# Patient Record
Sex: Female | Born: 1980 | Race: Black or African American | Hispanic: No | Marital: Single | Smoking: Never smoker
Health system: Southern US, Community
[De-identification: ages and names within clinical notes are randomized; demographics above are authoritative.]

## PROBLEM LIST (undated history)

## (undated) DIAGNOSIS — O039 Complete or unspecified spontaneous abortion without complication: Secondary | ICD-10-CM

## (undated) DIAGNOSIS — O24419 Gestational diabetes mellitus in pregnancy, unspecified control: Secondary | ICD-10-CM

## (undated) DIAGNOSIS — I10 Essential (primary) hypertension: Secondary | ICD-10-CM

## (undated) HISTORY — PX: COSMETIC SURGERY: SHX468

## (undated) HISTORY — DX: Gestational diabetes mellitus in pregnancy, unspecified control: O24.419

---

## 2013-08-27 HISTORY — PX: HERNIA REPAIR: SHX51

## 2014-05-05 ENCOUNTER — Encounter (HOSPITAL_BASED_OUTPATIENT_CLINIC_OR_DEPARTMENT_OTHER): Payer: Self-pay | Admitting: Emergency Medicine

## 2014-05-05 ENCOUNTER — Emergency Department (HOSPITAL_BASED_OUTPATIENT_CLINIC_OR_DEPARTMENT_OTHER)
Admission: EM | Admit: 2014-05-05 | Discharge: 2014-05-05 | Disposition: A | Discharge status: Home / Self Care | Payer: Self-pay | Attending: Emergency Medicine | Admitting: Emergency Medicine

## 2014-05-05 DIAGNOSIS — R51 Headache: Secondary | ICD-10-CM | POA: Insufficient documentation

## 2014-05-05 DIAGNOSIS — R61 Generalized hyperhidrosis: Secondary | ICD-10-CM | POA: Insufficient documentation

## 2014-05-05 DIAGNOSIS — Z79899 Other long term (current) drug therapy: Secondary | ICD-10-CM | POA: Insufficient documentation

## 2014-05-05 DIAGNOSIS — R5383 Other fatigue: Secondary | ICD-10-CM

## 2014-05-05 DIAGNOSIS — Z3202 Encounter for pregnancy test, result negative: Secondary | ICD-10-CM | POA: Insufficient documentation

## 2014-05-05 DIAGNOSIS — R5381 Other malaise: Secondary | ICD-10-CM | POA: Insufficient documentation

## 2014-05-05 DIAGNOSIS — R509 Fever, unspecified: Secondary | ICD-10-CM | POA: Insufficient documentation

## 2014-05-05 DIAGNOSIS — Z87891 Personal history of nicotine dependence: Secondary | ICD-10-CM | POA: Insufficient documentation

## 2014-05-05 DIAGNOSIS — J029 Acute pharyngitis, unspecified: Secondary | ICD-10-CM | POA: Insufficient documentation

## 2014-05-05 DIAGNOSIS — I10 Essential (primary) hypertension: Secondary | ICD-10-CM | POA: Insufficient documentation

## 2014-05-05 LAB — BASIC METABOLIC PANEL
ANION GAP: 13 (ref 5–15)
BUN: 14 mg/dL (ref 6–23)
CALCIUM: 9.4 mg/dL (ref 8.4–10.5)
CO2: 21 mEq/L (ref 19–32)
CREATININE: 1 mg/dL (ref 0.50–1.10)
Chloride: 104 mEq/L (ref 96–112)
GFR calc Af Amer: 85 mL/min — ABNORMAL LOW (ref >90)
GFR calc non Af Amer: 73 mL/min — ABNORMAL LOW (ref >90)
Glucose, Bld: 116 mg/dL — ABNORMAL HIGH (ref 70–99)
Potassium: 3.8 mEq/L (ref 3.7–5.3)
Sodium: 138 mEq/L (ref 137–147)

## 2014-05-05 LAB — CBC
HEMATOCRIT: 35 % — AB (ref 36.0–46.0)
Hemoglobin: 12.1 g/dL (ref 12.0–15.0)
MCH: 27.9 pg (ref 26.0–34.0)
MCHC: 34.6 g/dL (ref 30.0–36.0)
MCV: 80.8 fL (ref 78.0–100.0)
Platelets: 281 10*3/uL (ref 150–400)
RBC: 4.33 MIL/uL (ref 3.87–5.11)
RDW: 14.2 % (ref 11.5–15.5)
WBC: 9.5 10*3/uL (ref 4.0–10.5)

## 2014-05-05 LAB — URINALYSIS, ROUTINE W REFLEX MICROSCOPIC
BILIRUBIN URINE: NEGATIVE
GLUCOSE, UA: NEGATIVE mg/dL
HGB URINE DIPSTICK: NEGATIVE
Ketones, ur: NEGATIVE mg/dL
Leukocytes, UA: NEGATIVE
Nitrite: NEGATIVE
Protein, ur: NEGATIVE mg/dL
SPECIFIC GRAVITY, URINE: 1.024 (ref 1.005–1.030)
UROBILINOGEN UA: 0.2 mg/dL (ref 0.0–1.0)
pH: 6.5 (ref 5.0–8.0)

## 2014-05-05 LAB — PREGNANCY, URINE: Preg Test, Ur: NEGATIVE

## 2014-05-05 MED ORDER — HYDROCHLOROTHIAZIDE 25 MG PO TABS: 25.0000 mg | ORAL_TABLET | Freq: Every day | ORAL | Status: DC

## 2014-05-05 NOTE — ED Provider Notes (Signed)
CSN: [id number]     Arrival date & time 05/05/14  1119 History   First MD Initiated Contact with Patient 05/05/14 1154     Chief Complaint  Patient presents with  . Weakness     (Consider location/radiation/quality/duration/timing/severity/associated sxs/prior Treatment) HPI Comments: Patient complains of generalized weakness for several days that is greatest in the morning and improves throughout the day. She reports feeling lazy like she has no energy in the morning. She endorses URI symptoms since Monday: coughing, sneezing and subjective fevers. She also endorses Generalized HA. She has checked her BP at [facility] and was told it was high. She denies any other medical problems and doesn't take medications.       Patient is a 33 y.o. female presenting with weakness and headaches.  Weakness This is a recurrent problem. The current episode started in the past 7 days. The problem has been waxing and waning. Associated symptoms include congestion, diaphoresis, fatigue, a fever, headaches, a sore throat and weakness. Pertinent negatives include no abdominal pain, chest pain, chills, nausea, rash or vomiting. Nothing aggravates the symptoms. She has tried nothing for the symptoms. The treatment provided no relief.  Headache Pain location:  Generalized Severity currently:  0/10 Onset quality:  Gradual Duration:  3 days Progression:  Improving Context: not exposure to bright light and not loud noise   Relieved by:  Nothing Worsened by:  Nothing tried Associated symptoms: congestion, dizziness, fatigue, fever, focal weakness, sinus pressure, sore throat, URI and weakness   Associated symptoms: no abdominal pain, no back pain, no blurred vision, no diarrhea, no nausea, no photophobia and no vomiting     History reviewed. No pertinent past medical history. Past Surgical History  Procedure Laterality Date  . Cosmetic surgery      abdominal liposuction   No family history on file. History   Substance Use Topics  . Smoking status: Former Research scientist (life sciences)  . Smokeless tobacco: Never Used  . Alcohol Use: Yes     Comment: occassionally   OB History   Grav Para Term Preterm Abortions TAB SAB Ect Mult Living                 Review of Systems  Constitutional: Positive for fever, diaphoresis and fatigue. Negative for chills.  HENT: Positive for congestion, rhinorrhea, sinus pressure, sneezing and sore throat. Negative for trouble swallowing.   Eyes: Negative for blurred vision and photophobia.  Respiratory: Negative.   Cardiovascular: Negative for chest pain, palpitations and leg swelling.  Gastrointestinal: Negative for nausea, vomiting, abdominal pain and diarrhea.  Genitourinary: Negative for dysuria.  Musculoskeletal: Negative.  Negative for back pain.  Skin: Negative for rash.  Neurological: Positive for dizziness, focal weakness, weakness and headaches.      Allergies  Review of patient's allergies indicates no known allergies.  Home Medications   Prior to Admission medications   Medication Sig Start Date End Date Taking? Authorizing Provider  OVER THE COUNTER MEDICATION OTC allergy medications as needed.   Yes Historical Provider, MD  hydrochlorothiazide (HYDRODIURIL) 25 MG tablet Take 1 tablet (25 mg total) by mouth daily. 05/05/14   Olam Idler, MD   BP 146/110  Pulse 80  Temp(Src) 98.2 F (36.8 C) (Oral)  Resp 18  SpO2 100%  LMP 04/27/2014 Physical Exam  Vitals reviewed. Constitutional: She is oriented to person, place, and time. She appears well-developed and well-nourished. No distress.  HENT:  Mouth/Throat: Oropharynx is clear and moist.  Eyes: Conjunctivae are normal. Pupils are  equal, round, and reactive to light.  Cardiovascular: Normal rate.   No murmur heard. Pulmonary/Chest: Effort normal and breath sounds normal. No respiratory distress.  Abdominal: Soft. There is no tenderness.  Musculoskeletal: She exhibits no edema.  Neurological: She is  alert and oriented to person, place, and time.  Skin: No rash noted.    ED Course  Procedures (including critical care time) Labs Review Labs Reviewed  CBC - Abnormal; Notable for the following:    HCT 35.0 (*)    All other components within normal limits  BASIC METABOLIC PANEL - Abnormal; Notable for the following:    Glucose, Bld 116 (*)    GFR calc non Af Amer 73 (*)    GFR calc Af Amer 85 (*)    All other components within normal limits  PREGNANCY, URINE  URINALYSIS, ROUTINE W REFLEX MICROSCOPIC    Imaging Review No results found.   EKG Interpretation None      MDM   Final diagnoses:  Essential hypertension   Patient presented with multiple symptoms, including headache, dizziness, URI symptoms, and a generalized weakness, and found to be hypertensive in ED. Marland Kitchen  She reports having elevated blood pressure when checked at outside drugstores.  BMP within normal limits and urine pregnancy test negative.  Patient started on her side, and advised to establish PCP for ongoing evaluation and management of hypertension.     Olam Idler, MD 05/05/14 1314

## 2014-05-05 NOTE — ED Notes (Signed)
Patient states she woke up two days ago with a headache and generalized body aches.  States she used tylenol with minimal relief.  States she continued with a headache for two days, and was associated with a low grade fever .  Developed swelling in her eyes on the second day.  Now headache is resolved, continues to have swelling in her eyes, generalized weakness and low grade fever.

## 2014-05-05 NOTE — Discharge Instructions (Signed)
°Emergency Department Resource Guide °1) Find a Doctor and Pay Out of Pocket °Although you won't have to find out who is covered by your insurance plan, it is a good idea to ask around and get recommendations. You will then need to call the office and see if the doctor you have chosen will accept you as a new patient and what types of options they offer for patients who are self-pay. Some doctors offer discounts or will set up payment plans for their patients who do not have insurance, but you will need to ask so you aren't surprised when you get to your appointment. ° °2) Contact Your Local Health Department °Not all health departments have doctors that can see patients for sick visits, but many do, so it is worth a call to see if yours does. If you don't know where your local health department is, you can check in your phone book. The CDC also has a tool to help you locate your state's health department, and many state websites also have listings of all of their local health departments. ° °3) Find a Walk-in Clinic °If your illness is not likely to be very severe or complicated, you may want to try a walk in clinic. These are popping up all over the country in pharmacies, drugstores, and shopping centers. They're usually staffed by nurse practitioners or physician assistants that have been trained to treat common illnesses and complaints. They're usually fairly quick and inexpensive. However, if you have serious medical issues or chronic medical problems, these are probably not your best option. ° °No Primary Care Doctor: °- Call [facility] at  [phone] - they can help you locate a primary care doctor that  accepts your insurance, provides certain services, etc. °- Physician Referral Service- 1-800-533-3463 ° °Chronic Pain Problems: °Organization         Address  Phone   Notes  °[facility]  [phone] Patients need to be referred by their primary care doctor.  ° °Medication  Assistance: °Organization         Address  Phone   Notes  °[place] [facility] [address] °[place], [place] [address] [phone]) [phone] --Must be a resident of [place] °-- Must have NO insurance coverage whatsoever (no Medicaid/ Medicare, etc.) °-- The pt. MUST have a primary care doctor that directs their care regularly and follows them in the community °  °[facility]  (866) 331-1348   °[employer]  (888) 892-1162   ° °Agencies that provide inexpensive medical care: °Organization         Address  Phone   Notes  °[facility]  ([phone]) [phone]   °[facility]    ([phone]) [phone]   °[facility] [address] °[place], [place] [address] ([phone]) [phone]   °[facility] [address], °[place] (336) [phone]   °[facility]    (336) 373-0678   °[facility]    (336) 272-1050   °[facility] ° [address], [place] Phone:  (336) [phone], Fax:  ([phone]) [phone] Hours of Operation:  9 am - 6 pm, M-F.  Also accepts Medicaid/Medicare and self-pay.  °[facility] ° [address], [place] Phone: ([phone]) [phone], Fax: ([phone]) [phone]. Hours of Operation:  8:30 am - 5:30 pm, M-F.  Also accepts Medicaid and self-pay.  °[facility] [place] [address], [place] Phone: ([phone]) [phone]   °[facility] [address], [place], [place] (336)[phone], Ext. 123 Mondays & Thursdays: 7-9 AM.  First 15 patients are seen on a first come, first serve basis. °  ° °Medicaid-accepting [place] Providers: ° °Organization         Address  Phone   Notes  °[facility] [address], [place] ([phone]) [phone] Also accepts self-pay patients.  °[facility] [address], [place] ° ([phone]) [phone]   °[facility] [address], [place]  ([phone]) [phone]   °[facility] [address] [place] [address], [place] ([phone]) [phone]   °[facility] [address], [place]  ° ([phone]) [phone] Only accepts [place] Access Medicaid patients after they have their name applied to their card.  ° °Self-Pay (no insurance) in [place]: ° °Organization         Address  Phone   Notes  °Sickle Cell Patients, [facility] [address], [place] ([phone]) [phone]   °[facility] [address], [place] ([phone]) [phone]   °[facility] ° [address], [place] (336) [phone]   °[facility]/Dr. Osei-Bonsu ° [address] [place] [address], [place] or [address], [place] [phone]) [phone] Phone number for both [place] and [place] locations is the same.  °[facility] [address], [place] ([phone]) [phone]   °[facility] [address] [place] [address], [place] or [address] ([phone]) [phone] °(336) [phone]   °[facility] [address], [place] (336) [phone], phone; ([phone]) [phone], fax Sees patients 1st and 3rd Saturday of every month.  Must not qualify for public or private insurance (i.e. Medicaid, Medicare, [place] Health Choice, Veterans' Benefits) • Household income should be no more than 200% of the poverty level •The clinic cannot treat you if you are pregnant or think you are pregnant • Sexually transmitted diseases are not treated at the clinic.  ° ° °Dental Care: °Organization         Address  Phone  Notes  °[place] [facility] [address], [place] ([phone]) [phone] Accepts children up to age 21 who are enrolled in Medicaid or [place] Health Choice; pregnant women with a Medicaid card; and children who have applied for Medicaid or [place] Health Choice, but were declined, whose parents can pay a reduced fee at time of service.  °[place]  [facility] [place]  [address], [place] (336) 641-7733 Accepts children up to age 21 who are enrolled in Medicaid or [place] Health Choice; pregnant women with a Medicaid card; and children who have applied for Medicaid or [place] Health Choice, but were declined, whose parents can pay a reduced fee at time of service.  °[facility] ° [address],  ([phone]) [phone] Patients are seen by appointment only. Walk-ins are not accepted. [facility] will see patients 18 years of age and older. °Monday - Tuesday (8am-5pm) °Most Wednesdays (8:30-5pm) °$30 per visit, cash only  °[facility] ° [address], [place] ([phone]) [phone] Patients are seen by appointment only. Walk-ins are not accepted. [facility] will see patients 18 years of age and older. °One   Wednesday Evening (Monthly: Volunteer Based).  $30 per visit, cash only  [place]  [phone] for adults; Children under age 66, call Graduate Pediatric Dentistry at [phone]. Children aged 68-14, please call [phone] to request a pediatric application.  Dental services are provided in all areas of dental care including fillings, crowns and bridges, complete and partial dentures, implants, gum treatment, root canals, and extractions. Preventive care is also provided. Treatment is provided to both adults and children. Patients are selected via a lottery and there is often a waiting list.   [facility] [address], [place]  ([phone])[phone] [email or web address]   [facility] [address] [place], [place] [phone], Ext. [phone] Second and Fourth Thursday of each month, opens at 6:30 AM; Clinic ends at 9 AM.  Patients are seen on a first-come first-served basis, and a limited number are seen during each clinic.   [facility]  [address] [place], [place] [phone]    Eligibility Requirements You must have lived in [place], [place], or [place] counties for at least the last three months.   You cannot be eligible for state or federal sponsored [employer], including [employer], [place], or [employer].   You generally cannot be eligible for healthcare insurance through your employer.    How to apply: Eligibility screenings are held every Tuesday and Wednesday afternoon from 1:00 pm until 4:00 pm. You do not need an appointment for the interview!  [facility] [address] [place], [place]   [place]  [place] Department  [phone]     DASH Eating Plan DASH stands for "Dietary Approaches to Stop Hypertension." The DASH eating plan is a healthy eating plan that has been shown to reduce high blood pressure (hypertension). Additional health benefits may include reducing the risk of type 2 diabetes mellitus, heart disease, and stroke. The DASH eating plan may also help with weight loss. WHAT DO I NEED TO KNOW ABOUT THE DASH EATING PLAN? For the DASH eating plan, you will follow these general guidelines:  Choose foods with a percent daily value for sodium of less than 5% (as listed on the food label).  Use salt-free seasonings or herbs instead of table salt or sea salt.  Check with your health care provider or pharmacist before using salt substitutes.  Eat lower-sodium products, often labeled as "lower sodium" or "no salt added."  Eat fresh foods.  Eat more vegetables, fruits, and low-fat dairy products.  Choose whole grains. Look for the word "whole" as the first word in the ingredient list.  Choose fish and skinless chicken or [place] more often than red meat. Limit fish, poultry, and meat to 6 oz (170 g) each day.  Limit sweets, desserts, sugars, and sugary drinks.  Choose heart-healthy  fats.  Limit cheese to 1 oz (28 g) per day.  Eat more home-cooked food and less restaurant, buffet, and fast food.  Limit fried foods.  Cook foods using methods other than frying.  Limit canned vegetables. If you do use them, rinse them well to decrease the sodium.  When eating at a restaurant, ask that your food be prepared with less salt, or no salt if possible. WHAT FOODS CAN I EAT? Seek help from a dietitian for individual calorie needs. Grains Whole grain or whole wheat bread. Brown rice. Whole grain  or whole wheat pasta. Quinoa, bulgur, and whole grain cereals. Low-sodium cereals. Corn or whole wheat flour tortillas. Whole grain cornbread. Whole grain crackers. Low-sodium crackers. Vegetables Fresh or frozen vegetables (raw, steamed, roasted, or grilled). Low-sodium or reduced-sodium tomato and vegetable juices. Low-sodium or reduced-sodium tomato sauce and paste. Low-sodium or reduced-sodium canned vegetables.  Fruits All fresh, canned (in natural juice), or frozen fruits. Meat and Other Protein Products Ground beef (85% or leaner), grass-fed beef, or beef trimmed of fat. Skinless chicken or [place]. Ground chicken or [place]. Pork trimmed of fat. All fish and seafood. Eggs. Dried beans, peas, or lentils. Unsalted nuts and seeds. Unsalted canned beans. Dairy Low-fat dairy products, such as skim or 1% milk, 2% or reduced-fat cheeses, low-fat ricotta or cottage cheese, or plain low-fat yogurt. Low-sodium or reduced-sodium cheeses. Fats and Oils Tub margarines without trans fats. Light or reduced-fat mayonnaise and salad dressings (reduced sodium). Avocado. Safflower, olive, or canola oils. Natural peanut or almond butter. Other Unsalted popcorn and pretzels. The items listed above may not be a complete list of recommended foods or beverages. Contact your dietitian for more options. WHAT FOODS ARE NOT RECOMMENDED? Grains White bread. White pasta. White rice. Refined cornbread.  Bagels and croissants. Crackers that contain trans fat. Vegetables Creamed or fried vegetables. Vegetables in a cheese sauce. Regular canned vegetables. Regular canned tomato sauce and paste. Regular tomato and vegetable juices. Fruits Dried fruits. Canned fruit in light or heavy syrup. Fruit juice. Meat and Other Protein Products Fatty cuts of meat. Ribs, chicken wings, bacon, sausage, bologna, salami, chitterlings, fatback, hot dogs, bratwurst, and packaged luncheon meats. Salted nuts and seeds. Canned beans with salt. Dairy Whole or 2% milk, cream, half-and-half, and cream cheese. Whole-fat or sweetened yogurt. Full-fat cheeses or blue cheese. Nondairy creamers and whipped toppings. Processed cheese, cheese spreads, or cheese curds. Condiments Onion and garlic salt, seasoned salt, table salt, and sea salt. Canned and packaged gravies. Worcestershire sauce. Tartar sauce. Barbecue sauce. Teriyaki sauce. Soy sauce, including reduced sodium. Steak sauce. Fish sauce. Oyster sauce. Cocktail sauce. Horseradish. Ketchup and mustard. Meat flavorings and tenderizers. Bouillon cubes. Hot sauce. Tabasco sauce. Marinades. Taco seasonings. Relishes. Fats and Oils Butter, stick margarine, lard, shortening, ghee, and bacon fat. Coconut, palm kernel, or palm oils. Regular salad dressings. Other Pickles and olives. Salted popcorn and pretzels. The items listed above may not be a complete list of foods and beverages to avoid. Contact your dietitian for more information. WHERE CAN I FIND MORE INFORMATION? National Heart, Lung, and Blood Institute: travelstabloid.com Document Released: 08/02/2011 Document Revised: 12/28/2013 Document Reviewed: 06/17/2013 [facility] Patient Information 2015 [place], [place]. This information is not intended to replace advice given to you by your health care provider. Make sure you discuss any questions you have with your health care provider.

## 2014-05-06 NOTE — ED Provider Notes (Signed)
I saw and evaluated the patient, reviewed the resident's note and I agree with the findings and plan.   .Face to face Exam:  General:  Awake HEENT:  Atraumatic Resp:  Normal effort Abd:  Nondistended Neuro:No focal weakness   Dot Lanes, MD 05/06/14 1415

## 2014-05-25 ENCOUNTER — Emergency Department (HOSPITAL_BASED_OUTPATIENT_CLINIC_OR_DEPARTMENT_OTHER)
Admission: EM | Admit: 2014-05-25 | Discharge: 2014-05-25 | Disposition: A | Discharge status: Home / Self Care | Payer: Self-pay | Attending: Emergency Medicine | Admitting: Emergency Medicine

## 2014-05-25 ENCOUNTER — Encounter (HOSPITAL_BASED_OUTPATIENT_CLINIC_OR_DEPARTMENT_OTHER): Payer: Self-pay | Admitting: Emergency Medicine

## 2014-05-25 DIAGNOSIS — R42 Dizziness and giddiness: Secondary | ICD-10-CM | POA: Insufficient documentation

## 2014-05-25 DIAGNOSIS — R509 Fever, unspecified: Secondary | ICD-10-CM | POA: Insufficient documentation

## 2014-05-25 DIAGNOSIS — Z87891 Personal history of nicotine dependence: Secondary | ICD-10-CM | POA: Insufficient documentation

## 2014-05-25 DIAGNOSIS — H539 Unspecified visual disturbance: Secondary | ICD-10-CM | POA: Insufficient documentation

## 2014-05-25 DIAGNOSIS — I10 Essential (primary) hypertension: Secondary | ICD-10-CM | POA: Insufficient documentation

## 2014-05-25 MED ORDER — HYDROCHLOROTHIAZIDE 25 MG PO TABS
25.0000 mg | ORAL_TABLET | Freq: Every day | ORAL | Status: DC
Start: 2014-05-25 — End: 2014-08-16

## 2014-05-25 NOTE — ED Notes (Addendum)
Fever, headache dizziness x 4 weeks. She also states she does not want to take her BP medication any longer. Education attempted. She has been told the importance of a Primary Care Physician for follow up, stroke and other risk of untreated HTN.

## 2014-05-25 NOTE — Discharge Instructions (Signed)

## 2014-05-25 NOTE — ED Provider Notes (Signed)
CSN: [id number]     Arrival date & time 05/25/14  1258 History   First MD Initiated Contact with Patient 05/25/14 1323     Chief Complaint  Patient presents with  . Fever     (Consider location/radiation/quality/duration/timing/severity/associated sxs/prior Treatment) HPI Comments: Pt presents with headache.  She describes a 3 day hx of a bifrontal headache with some intermittent blurry vision and dizziness.  No n/v.  Has felt hot, but has not checked temperature.  Has not taken anything today for fever/pain.  Has hx of recently diagnosed HTN, was having these same symptoms when she was diagnosed a month ago.  Was started on HCTZ and the headache and dizziness went away after starting the medicine.  She stopped taking the medicine about 4-5 days ago because it was making her feel tired.  It was at that point that the symptoms returned.  She did take her HCTZ today for the first time in a few days.  She denies any neck pain.  No numbness or weakness to extremities.  No speech deficits or balance problems.  Patient is a 33 y.o. female presenting with fever.  Fever Associated symptoms: headaches   Associated symptoms: no chest pain, no chills, no congestion, no cough, no diarrhea, no nausea, no rash, no rhinorrhea and no vomiting     History reviewed. No pertinent past medical history. Past Surgical History  Procedure Laterality Date  . Cosmetic surgery      abdominal liposuction   No family history on file. History  Substance Use Topics  . Smoking status: Former Research scientist (life sciences)  . Smokeless tobacco: Never Used  . Alcohol Use: Yes     Comment: occassionally   OB History   Grav Para Term Preterm Abortions TAB SAB Ect Mult Living                 Review of Systems  Constitutional: Positive for fever (hot flashes, but no documented temp). Negative for chills, diaphoresis and fatigue.  HENT: Negative for congestion, rhinorrhea and sneezing.   Eyes: Positive for visual disturbance.   Respiratory: Negative for cough, chest tightness and shortness of breath.   Cardiovascular: Negative for chest pain and leg swelling.  Gastrointestinal: Negative for nausea, vomiting, abdominal pain, diarrhea and blood in stool.  Genitourinary: Negative for frequency, hematuria, flank pain and difficulty urinating.  Musculoskeletal: Negative for arthralgias and back pain.  Skin: Negative for rash.  Neurological: Positive for dizziness and headaches. Negative for speech difficulty, weakness and numbness.      Allergies  Review of patient's allergies indicates no known allergies.  Home Medications   Prior to Admission medications   Medication Sig Start Date End Date Taking? Authorizing Provider  hydrochlorothiazide (HYDRODIURIL) 25 MG tablet Take 1 tablet (25 mg total) by mouth daily. 05/05/14   Olam Idler, MD  hydrochlorothiazide (HYDRODIURIL) 25 MG tablet Take 1 tablet (25 mg total) by mouth daily. 05/25/14   Malvin Johns, MD  OVER THE COUNTER MEDICATION OTC allergy medications as needed.    Historical Provider, MD   BP 111/76  Pulse 71  Temp(Src) 98.8 F (37.1 C) (Oral)  Resp 18  Ht 5\' 7"  (1.702 m)  Wt 140 lb (63.504 kg)  BMI 21.92 kg/m2  SpO2 100%  LMP 05/20/2014 Physical Exam  Constitutional: She is oriented to person, place, and time. She appears well-developed and well-nourished.  HENT:  Head: Normocephalic and atraumatic.  Eyes: Pupils are equal, round, and reactive to light.  Neck: Normal range  of motion. Neck supple.  Cardiovascular: Normal rate, regular rhythm and normal heart sounds.   Pulmonary/Chest: Effort normal and breath sounds normal. No respiratory distress. She has no wheezes. She has no rales. She exhibits no tenderness.  Abdominal: Soft. Bowel sounds are normal. There is no tenderness. There is no rebound and no guarding.  Musculoskeletal: Normal range of motion. She exhibits no edema.  Lymphadenopathy:    She has no cervical adenopathy.   Neurological: She is alert and oriented to person, place, and time. She has normal strength. No cranial nerve deficit or sensory deficit. GCS eye subscore is 4. GCS verbal subscore is 5. GCS motor subscore is 6.  FTN intact, gait normal  Skin: Skin is warm and dry. No rash noted.  Psychiatric: She has a normal mood and affect.    ED Course  Procedures (including critical care time) Labs Review Labs Reviewed - No data to display  Imaging Review No results found.   EKG Interpretation None      MDM   Final diagnoses:  Essential hypertension    Pt with frontal headache, no neuro deficits.  No suggestions of CVA, ICH.  Symptoms same as she was having before she started her HCTZ last month.  Likely related to HTN.  She did take her med this am and BP okay now.  Will d/c.  Given another rx for HCTZ.  Explained importance of taking meds.  Pt does not want to change to another BP med.  Given resource guide for outpt f/u.    Malvin Johns, MD 05/25/14 [phone]

## 2014-06-02 ENCOUNTER — Encounter: Payer: Self-pay | Admitting: Medical

## 2014-06-02 DIAGNOSIS — Z0289 Encounter for other administrative examinations: Secondary | ICD-10-CM

## 2014-06-03 NOTE — Progress Notes (Signed)
This encounter was created in error - please disregard.

## 2014-06-15 ENCOUNTER — Encounter: Payer: Self-pay | Admitting: Medical

## 2014-07-05 ENCOUNTER — Ambulatory Visit: Payer: Self-pay | Admitting: Medical

## 2014-07-05 ENCOUNTER — Emergency Department (HOSPITAL_BASED_OUTPATIENT_CLINIC_OR_DEPARTMENT_OTHER)
Admission: EM | Admit: 2014-07-05 | Discharge: 2014-07-05 | Disposition: A | Discharge status: Home / Self Care | Payer: Self-pay | Attending: Emergency Medicine | Admitting: Emergency Medicine

## 2014-07-05 ENCOUNTER — Encounter (HOSPITAL_BASED_OUTPATIENT_CLINIC_OR_DEPARTMENT_OTHER): Payer: Self-pay | Admitting: *Deleted

## 2014-07-05 ENCOUNTER — Telehealth: Payer: Self-pay | Admitting: *Deleted

## 2014-07-05 DIAGNOSIS — Z87891 Personal history of nicotine dependence: Secondary | ICD-10-CM | POA: Insufficient documentation

## 2014-07-05 DIAGNOSIS — J069 Acute upper respiratory infection, unspecified: Secondary | ICD-10-CM | POA: Insufficient documentation

## 2014-07-05 DIAGNOSIS — I10 Essential (primary) hypertension: Secondary | ICD-10-CM | POA: Insufficient documentation

## 2014-07-05 DIAGNOSIS — Z79899 Other long term (current) drug therapy: Secondary | ICD-10-CM | POA: Insufficient documentation

## 2014-07-05 HISTORY — DX: Essential (primary) hypertension: I10

## 2014-07-05 MED ORDER — HYDROCHLOROTHIAZIDE 25 MG PO TABS: 25.0000 mg | ORAL_TABLET | Freq: Every day | ORAL | Status: DC

## 2014-07-05 NOTE — ED Notes (Signed)
MD at bedside. 

## 2014-07-05 NOTE — ED Provider Notes (Signed)
CSN: [id number]     Arrival date & time 07/05/14  9935 History   First MD Initiated Contact with Patient 07/05/14 0840     Chief Complaint  Patient presents with  . Influenza     (Consider location/radiation/quality/duration/timing/severity/associated sxs/prior Treatment) HPI Complains of cough and sneeze and rhinorrhea onset 5 days ago. No other symptoms. Treated with over-the-counter cold medicine without relief.( does not recall its name). No sore throat no headache no myalgias. States nothing hurts. No lightheadedness no other associated symptoms. Nothing makes symptoms better or worse Past Medical History  Diagnosis Date  . Hypertension    Past Surgical History  Procedure Laterality Date  . Cosmetic surgery      abdominal liposuction   No family history on file. History  Substance Use Topics  . Smoking status: Former Research scientist (life sciences)  . Smokeless tobacco: Never Used  . Alcohol Use: Yes     Comment: occassionally   OB History    Gravida Para Term Preterm AB TAB SAB Ectopic Multiple Living   0 0 0 0 0 0 0 0 0 0      Review of Systems  Constitutional: Positive for fever.       Subjective fever  HENT: Positive for rhinorrhea and sneezing.   Respiratory: Positive for cough. Negative for shortness of breath.   Cardiovascular: Negative.   Gastrointestinal: Negative.   Musculoskeletal: Negative.   Skin: Negative.   Neurological: Negative.   Psychiatric/Behavioral: Negative.   All other systems reviewed and are negative.     Allergies  Review of patient's allergies indicates no known allergies.  Home Medications   Prior to Admission medications   Medication Sig Start Date End Date Taking? Authorizing Provider  hydrochlorothiazide (HYDRODIURIL) 25 MG tablet Take 1 tablet (25 mg total) by mouth daily. 05/05/14   Olam Idler, MD  hydrochlorothiazide (HYDRODIURIL) 25 MG tablet Take 1 tablet (25 mg total) by mouth daily. 05/25/14   Malvin Johns, MD  OVER THE COUNTER MEDICATION  OTC allergy medications as needed.    Historical Provider, MD   BP 128/90 mmHg  Pulse 84  Temp(Src) 98.8 F (37.1 C) (Oral)  Ht 5\' 7"  (1.702 m)  Wt 130 lb (58.968 kg)  BMI 20.36 kg/m2  SpO2 100%  LMP 06/12/2014 Physical Exam  Constitutional: She appears well-developed and well-nourished. No distress.  HENT:  Head: Normocephalic and atraumatic.  Bilateral tympanic membranes normal  Eyes: Conjunctivae are normal. Pupils are equal, round, and reactive to light.  Neck: Neck supple. No tracheal deviation present. No thyromegaly present.  Cardiovascular: Normal rate and regular rhythm.   No murmur heard. Pulmonary/Chest: Effort normal and breath sounds normal.  Abdominal: Soft. Bowel sounds are normal. She exhibits no distension. There is no tenderness.  Musculoskeletal: Normal range of motion. She exhibits no edema or tenderness.  Lymphadenopathy:    She has no cervical adenopathy.  Neurological: She is alert. Coordination normal.  Skin: Skin is warm and dry. No rash noted.  Psychiatric: She has a normal mood and affect.  Nursing note and vitals reviewed.   ED Course  Procedures (including critical care time) Labs Review Labs Reviewed - No data to display  Imaging Review No results found.   EKG Interpretation None      MDM  Doubt influenza. No myalgias, no sore throat, no headache,  Final diagnoses:  None  plan prescription HCTZ patient has run out. 30 day supply. Referral health Department or resource guide to get primary care physician Diagnosis #1  upper respiratory tract infection #2 medication refill      Orlie Dakin, MD 07/05/14 [phone]

## 2014-07-05 NOTE — Discharge Instructions (Signed)
Upper Respiratory Infection, Adult Go to your local health department or contact any of the numbers on the resource guide to get a primary care physician. Your new physician can write for medication refills for you An upper respiratory infection (URI) is also sometimes known as the common cold. The upper respiratory tract includes the nose, sinuses, throat, trachea, and bronchi. Bronchi are the airways leading to the lungs. Most people improve within 1 week, but symptoms can last up to 2 weeks. A residual cough may last even longer.  CAUSES Many different viruses can infect the tissues lining the upper respiratory tract. The tissues become irritated and inflamed and often become very moist. Mucus production is also common. A cold is contagious. You can easily spread the virus to others by oral contact. This includes kissing, sharing a glass, coughing, or sneezing. Touching your mouth or nose and then touching a surface, which is then touched by another person, can also spread the virus. SYMPTOMS  Symptoms typically develop 1 to 3 days after you come in contact with a cold virus. Symptoms vary from person to person. They may include:  Runny nose.  Sneezing.  Nasal congestion.  Sinus irritation.  Sore throat.  Loss of voice (laryngitis).  Cough.  Fatigue.  Muscle aches.  Loss of appetite.  Headache.  Low-grade fever. DIAGNOSIS  You might diagnose your own cold based on familiar symptoms, since most people get a cold 2 to 3 times a year. Your caregiver can confirm this based on your exam. Most importantly, your caregiver can check that your symptoms are not due to another disease such as strep throat, sinusitis, pneumonia, asthma, or epiglottitis. Blood tests, throat tests, and X-rays are not necessary to diagnose a common cold, but they may sometimes be helpful in excluding other more serious diseases. Your caregiver will decide if any further tests are required. RISKS AND  COMPLICATIONS  You may be at risk for a more severe case of the common cold if you smoke cigarettes, have chronic heart disease (such as heart failure) or lung disease (such as asthma), or if you have a weakened immune system. The very young and very old are also at risk for more serious infections. Bacterial sinusitis, middle ear infections, and bacterial pneumonia can complicate the common cold. The common cold can worsen asthma and chronic obstructive pulmonary disease (COPD). Sometimes, these complications can require emergency medical care and may be life-threatening. PREVENTION  The best way to protect against getting a cold is to practice good hygiene. Avoid oral or hand contact with people with cold symptoms. Wash your hands often if contact occurs. There is no clear evidence that vitamin C, vitamin E, echinacea, or exercise reduces the chance of developing a cold. However, it is always recommended to get plenty of rest and practice good nutrition. TREATMENT  Treatment is directed at relieving symptoms. There is no cure. Antibiotics are not effective, because the infection is caused by a virus, not by bacteria. Treatment may include:  Increased fluid intake. Sports drinks offer valuable electrolytes, sugars, and fluids.  Breathing heated mist or steam (vaporizer or shower).  Eating chicken soup or other clear broths, and maintaining good nutrition.  Getting plenty of rest.  Using gargles or lozenges for comfort.  Controlling fevers with ibuprofen or acetaminophen as directed by your caregiver.  Increasing usage of your inhaler if you have asthma. Zinc gel and zinc lozenges, taken in the first 24 hours of the common cold, can shorten the duration  and lessen the severity of symptoms. Pain medicines may help with fever, muscle aches, and throat pain. A variety of non-prescription medicines are available to treat congestion and runny nose. Your caregiver can make recommendations and may  suggest nasal or lung inhalers for other symptoms.  HOME CARE INSTRUCTIONS   Only take over-the-counter or prescription medicines for pain, discomfort, or fever as directed by your caregiver.  Use a warm mist humidifier or inhale steam from a shower to increase air moisture. This may keep secretions moist and make it easier to breathe.  Drink enough water and fluids to keep your urine clear or pale yellow.  Rest as needed.  Return to work when your temperature has returned to normal or as your caregiver advises. You may need to stay home longer to avoid infecting others. You can also use a face mask and careful hand washing to prevent spread of the virus. SEEK MEDICAL CARE IF:   After the first few days, you feel you are getting worse rather than better.  You need your caregiver's advice about medicines to control symptoms.  You develop chills, worsening shortness of breath, or brown or red sputum. These may be signs of pneumonia.  You develop yellow or brown nasal discharge or pain in the face, especially when you bend forward. These may be signs of sinusitis.  You develop a fever, swollen neck glands, pain with swallowing, or white areas in the back of your throat. These may be signs of strep throat. SEEK IMMEDIATE MEDICAL CARE IF:   You have a fever.  You develop severe or persistent headache, ear pain, sinus pain, or chest pain.  You develop wheezing, a prolonged cough, cough up blood, or have a change in your usual mucus (if you have chronic lung disease).  You develop sore muscles or a stiff neck. Document Released: 02/06/2001 Document Revised: 11/05/2011 Document Reviewed: 11/18/2013 [facility] Patient Information 2015 [place], [place]. This information is not intended to replace advice given to you by your health care provider. Make sure you discuss any questions you have with your health care provider.  Emergency Department Resource Guide 1) Find a Doctor and Pay Out of  Pocket Although you won't have to find out who is covered by your insurance plan, it is a good idea to ask around and get recommendations. You will then need to call the office and see if the doctor you have chosen will accept you as a new patient and what types of options they offer for patients who are self-pay. Some doctors offer discounts or will set up payment plans for their patients who do not have insurance, but you will need to ask so you aren't surprised when you get to your appointment.  2) Contact Your Local Health Department Not all health departments have doctors that can see patients for sick visits, but many do, so it is worth a call to see if yours does. If you don't know where your local health department is, you can check in your phone book. The CDC also has a tool to help you locate your state's health department, and many state websites also have listings of all of their local health departments.  3) Find a [facility] If your illness is not likely to be very severe or complicated, you may want to try a walk in clinic. These are popping up all over the country in pharmacies, drugstores, and shopping centers. They're usually staffed by nurse practitioners or physician assistants that have  been trained to treat common illnesses and complaints. They're usually fairly quick and inexpensive. However, if you have serious medical issues or chronic medical problems, these are probably not your best option.  No Primary Care Doctor: - Call Health Connect at  [phone] - they can help you locate a primary care doctor that  accepts your insurance, provides certain services, etc. - Physician Referral Service- [phone]  Chronic Pain Problems: Organization         Address  Phone   Notes  [facility]  [phone] Patients need to be referred by their primary care doctor.   Medication Assistance: Organization         Address  Phone   Notes  [facility]  Medication [facility] [address] [place], [place] [address] [phone] --Must be a resident of [facility] -- Must have NO insurance coverage whatsoever (no Medicaid/ Medicare, etc.) -- The pt. MUST have a primary care doctor that directs their care regularly and follows them in the community   [facility]ist  [phone]   [employer]  [phone]    Agencies that provide inexpensive medical care: Organization         Address  Phone   Notes  [place]  [phone]   [facility]    [phone]   [facility], [place] [address] [phone]   [place] [address], [place] [phone]   Planned Parenthood    [phone]   [facility]    [phone]   [place] and [place] [address], [place] Phone:  [phone], Fax:  [phone] Hours of Operation:  9 am - 6 pm, M-F.  Also accepts Medicaid/Medicare and self-pay.  [facility] for [place], [address], [place] Phone: [phone], Fax: [phone]. Hours of Operation:  8:30 am - 5:30 pm, M-F.  Also accepts Medicaid and self-pay.  [facility] [address] [place] Phone: [phone]   [place], [place], [place] [phone], Ext. [phone] Mondays & Thursdays: 7-9 AM.  First 15 patients are seen on a first come, first serve basis.    [place] Providers:  Organization         Address  Phone   Notes  [facility] [address], [place] [phone] Also accepts self-pay patients.  [facility] [address], [place]  [phone]   [place], [address], [place] [phone]   [facility] [address] [place] [phone]   Lucianne Lei [address], [place]   [phone] Only accepts [place] Access [place] patients after they have their name applied to their card.   Self-Pay (no insurance) in [facility]:  Organization         Address  Phone   Notes  Sickle Cell Patients, [facility] [place] [phone]   [facility] [place] [phone]   [facility] Pontes Urgent [place]  [place], [address], [place] [phone]   [facility]/Dr. Osei-Bonsu  [address] or  [place], [address], [place] [phone] Phone number for both [facility] and [place] locations is the same.  Urgent Medical and [facility] [address], [place] [phone]   [facility] [address] [place] or [address] [phone]   [facility] [address] [place] [phone], phone; [phone], fax Sees patients 1st and 3rd Saturday of every month.  Must not qualify for public or private insurance (i.e. Medicaid, Medicare, [place] Health Choice, Veterans' Benefits)  Household income should be no more than 200% of the poverty level The clinic cannot treat you if you are pregnant or think you are pregnant  Sexually transmitted diseases are not treated at the clinic.    Dental Care: Organization         Address  Phone  Notes  [facility] Department of [facility] [place] [phone] Accepts children up to age 59 who are enrolled in [place] or [place]; pregnant women with a Medicaid card; and children who have applied for Medicaid or [place] Health Choice, but were declined, whose parents can pay a reduced fee at time of service.  [facility] Department of [facility]  [address], [place]  [phone] Accepts children up to age 29 who are enrolled in [place] or [place]; pregnant women with a Medicaid card; and children who have applied for Medicaid or [place] Health Choice, but were declined, whose parents can pay a reduced fee at time of service.  [place] Adult Dental Access PROGRAM  [place] [phone] Patients are seen by appointment only. Walk-ins are not accepted. [place] will see patients 57 years of age and older. Monday - Tuesday (8am-5pm) Most Wednesdays (8:30-5pm) $30 per visit, cash only  [facility] Adult Dental Access PROGRAM  [address], [facility] [phone] Patients are seen by appointment only. Walk-ins are not accepted. [place] will see patients 61 years of age and older. One Wednesday Evening (Monthly: Volunteer Based).  $30 per visit, cash only  [place]  [phone] for adults; Children under age 44, call Graduate Pediatric Dentistry at [phone]. Children aged 11-14, please call [phone] to request a pediatric application.  Dental services are provided in all areas of dental care including fillings, crowns and bridges, complete and partial dentures, implants, gum treatment, root canals, and extractions. Preventive care is also provided. Treatment is provided to both adults and children. Patients are selected via a lottery and there is often a waiting list.   [facility] [address] [place]  [phone] [email or web address]   [facility] [address] [place], [place] [phone], Ext. [phone] Second and Fourth Thursday of each month, opens at 6:30 AM; Clinic ends at 9 AM.  Patients are seen on a first-come first-served basis, and a limited number are seen during each clinic.   [facility]  [address] Hillard Danker [place], [place] [phone]   Eligibility Requirements You must have lived in [place], [place], or [place]  counties for at least the last three months.   You cannot be eligible for state or federal sponsored [employer], including [employer], [place], or [employer].   You generally cannot be eligible for healthcare insurance through your employer.    How to apply: Eligibility screenings  are held every Tuesday and Wednesday afternoon from 1:00 pm until 4:00 pm. You do not need an appointment for the interview!  [facility] [address], [place], [place]   [place]  Welcome Department  [place]  [phone]    Behavioral Health Resources in the Community: Intensive Outpatient Programs Organization         Address  Phone  Notes  [place]. [address], [place], [place] [phone]   [facility] Outpatient [address], [place], [place]   ADS: Alcohol & Drug Svcs [address] [place], [place]   [place] [address],  [place], [place] or [phone]   Substance Abuse Resources Organization         Address  Phone  Notes  Alcohol and Drug Services  [phone]   [place]  [phone]   [facility] [place]   Chinita Pester  [phone]   Residential & Outpatient Substance Abuse Program  [phone]   Psychological Services Organization         Address  Phone  Notes  [facility] [place]  [place]  [phone]   [place] [address] [place] or [phone]    Mobile Crisis Teams Organization         Address  Phone  Notes  [facility], Mobile Crisis Care Unit  [phone]   [facility]ive Psychotherapeutic Services  [address] [place], [place]   [place] Levels [address], [place] [phone]    Self-Help/Support Groups Organization         Address  Phone             Notes  [place]. of [place] - variety of support groups  [place] Call for more information  Narcotics Anonymous (NA), [facility] [address], [employer] [place]  2 meetings at this location   Special educational needs teacher         Address  Phone  Notes  ASAP Residential Treatment [place],    [place]  [phone]   [facility]  [address] [place] [address]606, [place], [place]   [place], [place] [phone] Admissions: 8am-3pm M-F  Incentives Substance [place] [address],    [place], [place] [phone]   [facility] [address] [place], [place], [place]   [facility] [address],  [place], [place]   [facility] Programs - Intensive Outpatient [address], [place], [place]   [facility] [place]  [place], [place] [phone] or [phone]   [facility] (RTS) [address], [place], [place] Accepts Medicaid  [facility] [place] [address],  [place] [phone] Substance Abuse/Addiction Treatment   [facility] Organization         Address  Phone  Notes  [facility]  [phone]   Domenic Schwab, PhD [address] Arlis Porta [place], [place]   [phone] or [phone]   [place]   [address] [place], [place] [phone]   [facility] [place] [address] [place], [place] [phone] Insurance/Medicaid/sponsorship  through [facility] and Families [address], Ste [place], [place] [phone] [place], [place] [phone]    Dr. Adele Schilder  [phone]   [facility] Dept. 1) [address] [place] 2) [place] 3)  [address], [place] [phone]  [phone]   [place] [phone] or [phone] (After Hours)

## 2014-07-05 NOTE — ED Notes (Signed)
Pt c/o of flu like symptoms. Pt states fever yesterday and cough.

## 2014-07-05 NOTE — Telephone Encounter (Signed)
Pt did not show for appointment 07/05/2014 at 9am to [place]

## 2014-08-16 ENCOUNTER — Encounter: Payer: Self-pay | Admitting: Medical

## 2014-08-16 ENCOUNTER — Ambulatory Visit (INDEPENDENT_AMBULATORY_CARE_PROVIDER_SITE_OTHER): Payer: Self-pay | Admitting: Medical

## 2014-08-16 VITALS — BP 124/78 | HR 90 | Temp 98.4°F | Ht 67.5 in | Wt 152.0 lb

## 2014-08-16 DIAGNOSIS — I10 Essential (primary) hypertension: Secondary | ICD-10-CM

## 2014-08-16 MED ORDER — HYDROCHLOROTHIAZIDE 25 MG PO TABS: 25.0000 mg | ORAL_TABLET | Freq: Every day | ORAL | Status: DC

## 2014-08-16 NOTE — Assessment & Plan Note (Addendum)
.  For your htn, I am prescribing your hctz 25 mg q day. Make sure you are eating some k in your diet such as bananas. Check you bp couple of times a week and we want to see less than 140/90 consistently.    Please get labs today and follow up here in 3 months or as needed

## 2014-08-16 NOTE — Progress Notes (Signed)
Subjective:    Patient ID: Mariah Livingston, female    DOB: 1981-03-22, 33 y.o.   MRN: [id number]  HPI   I have reviewed pt PMH, PSH, FH, Social History and Surgical History.  Hypertension- dx for 5 yrs. Pt is on HCTZ for this. No hx of low potassium.   Only cosmetic sx for abdomen. Liposuction.  No smoker. No alcohol.  Pt works for [employer], Exercises 7 days a week(jogs in the park), coffee 1 cup a day, single, no children.  Pt has no acute problems. Pt last had check up was October.  She reports not being completely compliant on bp medication. She takes bp medication every other day. She states this is due to cost of visits. She does not have insurance.  LMP- this December and this normal.   Past Medical History  Diagnosis Date  . Hypertension     History   Social History  . Marital Status: Single    Spouse Name: N/A    Number of Children: N/A  . Years of Education: N/A   Occupational History  . Not on file.   Social History Main Topics  . Smoking status: Never Smoker   . Smokeless tobacco: Never Used  . Alcohol Use: Yes     Comment: occassionally  . Drug Use: No  . Sexual Activity: Yes   Other Topics Concern  . Not on file   Social History Narrative    Past Surgical History  Procedure Laterality Date  . Cosmetic surgery      abdominal liposuction    Family History  Problem Relation Age of Onset  . Hypertension Mother     No Known Allergies  Current Outpatient Prescriptions on File Prior to Visit  Medication Sig Dispense Refill  . hydrochlorothiazide (HYDRODIURIL) 25 MG tablet Take 1 tablet (25 mg total) by mouth daily. 30 tablet 0  . OVER THE COUNTER MEDICATION OTC allergy medications as needed.     No current facility-administered medications on file prior to visit.    BP 124/78 mmHg  Pulse 90  Temp(Src) 98.4 F (36.9 C) (Oral)  Ht 5' 7.5" (1.715 m)  Wt 152 lb (68.947 kg)  BMI 23.44 kg/m2  SpO2 99%  LMP  08/07/2014         Review of Systems  Constitutional: Negative for fever, chills, diaphoresis, activity change and fatigue.  Respiratory: Negative for cough, chest tightness, shortness of breath and wheezing.   Cardiovascular: Negative for chest pain, palpitations and leg swelling.  Gastrointestinal: Negative for nausea, vomiting and abdominal pain.  Musculoskeletal: Negative for neck pain and neck stiffness.  Neurological: Negative for dizziness, tremors, seizures, syncope, facial asymmetry, speech difficulty, weakness, light-headedness, numbness and headaches.  Hematological: Negative for adenopathy. Does not bruise/bleed easily.  Psychiatric/Behavioral: Negative for behavioral problems, confusion and agitation. The patient is not nervous/anxious.        Objective:   Physical Exam  General Mental Status- Alert. General Appearance- Not in acute distress.   Skin General: Color- Normal Color. Moisture- Normal Moisture.  Neck Carotid Arteries- Normal color. Moisture- Normal Moisture. No carotid bruits. No JVD.  Chest and Lung Exam Auscultation: Breath Sounds:-Normal. CTA.  Cardiovascular Auscultation:Rythm- Regular, Rate and Rhythm. Murmurs & Other Heart Sounds:Auscultation of the heart reveals- No Murmurs.   Neurologic Cranial Nerve exam:- CN III-XII intact(No nystagmus), symmetric smile. Drift Test:- No drift. [place]berg Exam:- Negative.  Heal to Toe Gait exam:-Normal. Finger to Nose:- Normal/Intact Strength:- 5/5 equal and symmetric strength both  upper and lower extremities.       Assessment & Plan:

## 2014-08-16 NOTE — Progress Notes (Signed)
Pre visit review using our clinic review tool, if applicable. No additional management support is needed unless otherwise documented below in the visit note. 

## 2014-08-16 NOTE — Patient Instructions (Addendum)
For your htn, I am prescribing your hctz 25 mg q day. Make sure you are eating some k in your diet such as bananas. Check you bp couple of times a week and we want to see less than 140/90 consistently.    Please get labs today and follow up here in 3 months or as needed

## 2014-10-24 ENCOUNTER — Emergency Department (HOSPITAL_BASED_OUTPATIENT_CLINIC_OR_DEPARTMENT_OTHER)
Admission: EM | Admit: 2014-10-24 | Discharge: 2014-10-25 | Disposition: A | Discharge status: Home / Self Care | Payer: Medicaid Other | Attending: Emergency Medicine | Admitting: Emergency Medicine

## 2014-10-24 ENCOUNTER — Encounter (HOSPITAL_BASED_OUTPATIENT_CLINIC_OR_DEPARTMENT_OTHER): Payer: Self-pay | Admitting: Emergency Medicine

## 2014-10-24 ENCOUNTER — Emergency Department (HOSPITAL_BASED_OUTPATIENT_CLINIC_OR_DEPARTMENT_OTHER): Payer: Medicaid Other

## 2014-10-24 DIAGNOSIS — M549 Dorsalgia, unspecified: Secondary | ICD-10-CM | POA: Diagnosis not present

## 2014-10-24 DIAGNOSIS — O9989 Other specified diseases and conditions complicating pregnancy, childbirth and the puerperium: Secondary | ICD-10-CM | POA: Insufficient documentation

## 2014-10-24 DIAGNOSIS — O209 Hemorrhage in early pregnancy, unspecified: Secondary | ICD-10-CM | POA: Insufficient documentation

## 2014-10-24 DIAGNOSIS — Z3A01 Less than 8 weeks gestation of pregnancy: Secondary | ICD-10-CM | POA: Insufficient documentation

## 2014-10-24 DIAGNOSIS — R109 Unspecified abdominal pain: Secondary | ICD-10-CM | POA: Insufficient documentation

## 2014-10-24 DIAGNOSIS — R52 Pain, unspecified: Secondary | ICD-10-CM

## 2014-10-24 DIAGNOSIS — O161 Unspecified maternal hypertension, first trimester: Secondary | ICD-10-CM | POA: Insufficient documentation

## 2014-10-24 DIAGNOSIS — Z3490 Encounter for supervision of normal pregnancy, unspecified, unspecified trimester: Secondary | ICD-10-CM

## 2014-10-24 LAB — URINALYSIS, ROUTINE W REFLEX MICROSCOPIC
Bilirubin Urine: NEGATIVE
Glucose, UA: NEGATIVE mg/dL
KETONES UR: NEGATIVE mg/dL
Leukocytes, UA: NEGATIVE
NITRITE: NEGATIVE
PH: 6 (ref 5.0–8.0)
Protein, ur: NEGATIVE mg/dL
Specific Gravity, Urine: 1.023 (ref 1.005–1.030)
UROBILINOGEN UA: 0.2 mg/dL (ref 0.0–1.0)

## 2014-10-24 LAB — COMPREHENSIVE METABOLIC PANEL
ALBUMIN: 3.9 g/dL (ref 3.5–5.2)
ALT: 15 U/L (ref 0–35)
AST: 20 U/L (ref 0–37)
Alkaline Phosphatase: 76 U/L (ref 39–117)
Anion gap: 3 — ABNORMAL LOW (ref 5–15)
BUN: 13 mg/dL (ref 6–23)
CO2: 23 mmol/L (ref 19–32)
Calcium: 8.6 mg/dL (ref 8.4–10.5)
Chloride: 107 mmol/L (ref 96–112)
Creatinine, Ser: 0.84 mg/dL (ref 0.50–1.10)
GFR calc Af Amer: >90 mL/min (ref >90)
GFR, EST NON AFRICAN AMERICAN: 90 mL/min — AB (ref >90)
Glucose, Bld: 148 mg/dL — ABNORMAL HIGH (ref 70–99)
Potassium: 3.3 mmol/L — ABNORMAL LOW (ref 3.5–5.1)
SODIUM: 133 mmol/L — AB (ref 135–145)
Total Bilirubin: 0.3 mg/dL (ref 0.3–1.2)
Total Protein: 7.6 g/dL (ref 6.0–8.3)

## 2014-10-24 LAB — CBC WITH DIFFERENTIAL/PLATELET
Basophils Absolute: 0 10*3/uL (ref 0.0–0.1)
Basophils Relative: 0 % (ref 0–1)
EOS ABS: 0.1 10*3/uL (ref 0.0–0.7)
EOS PCT: 1 % (ref 0–5)
HCT: 35.4 % — ABNORMAL LOW (ref 36.0–46.0)
HEMOGLOBIN: 11.7 g/dL — AB (ref 12.0–15.0)
LYMPHS ABS: 3.8 10*3/uL (ref 0.7–4.0)
Lymphocytes Relative: 38 % (ref 12–46)
MCH: 26.8 pg (ref 26.0–34.0)
MCHC: 33.1 g/dL (ref 30.0–36.0)
MCV: 81.2 fL (ref 78.0–100.0)
Monocytes Absolute: 1 10*3/uL (ref 0.1–1.0)
Monocytes Relative: 10 % (ref 3–12)
NEUTROS ABS: 5.1 10*3/uL (ref 1.7–7.7)
NEUTROS PCT: 51 % (ref 43–77)
PLATELETS: 261 10*3/uL (ref 150–400)
RBC: 4.36 MIL/uL (ref 3.87–5.11)
RDW: 14.5 % (ref 11.5–15.5)
WBC: 10.1 10*3/uL (ref 4.0–10.5)

## 2014-10-24 LAB — URINE MICROSCOPIC-ADD ON

## 2014-10-24 LAB — HCG, QUANTITATIVE, PREGNANCY: hCG, Beta Chain, Quant, S: 718 m[IU]/mL — ABNORMAL HIGH (ref <5)

## 2014-10-24 LAB — PREGNANCY, URINE: PREG TEST UR: POSITIVE — AB

## 2014-10-24 MED ORDER — SODIUM CHLORIDE 0.9 % IV BOLUS (SEPSIS)
1000.0000 mL | Freq: Once | INTRAVENOUS | Status: AC
  Administered 2014-10-24: 1000 mL via INTRAVENOUS

## 2014-10-24 MED ORDER — ACETAMINOPHEN 500 MG PO TABS
1000.0000 mg | ORAL_TABLET | Freq: Once | ORAL | Status: AC
  Administered 2014-10-24: 1000 mg via ORAL
  Filled 2014-10-24: qty 2

## 2014-10-24 NOTE — ED Notes (Signed)
Pt reports low abd pain and urinary frequency as well as lower and upper back pain

## 2014-10-24 NOTE — ED Provider Notes (Signed)
CSN: [id number]     Arrival date & time 10/24/14  1910 History  This chart was scribed for Alexio Sroka, PA-C by Edison Simon, ED Scribe. This patient was seen in room MHOTF/OTF and the patient's care was started at 5:11 PM.    Chief Complaint  Patient presents with  . Urinary Tract Infection   The history is provided by the patient. No language interpreter was used.    HPI Comments: Mariah Livingston is a 34 y.o. female who presents to the Emergency Department complaining of lower abdominal pain with onset 6 days ago. She rates pain at 1/10 now but states it was worse yesterday; she states she is not having any abdominal pain now. She reports associated frequent urination, vaginal bleeding, diarrhea and low back pain. She states she felt like she was going to pass out while using bathroom yesterday.  She states she is expecting a period earlier this week and last period was on 09/20/2014. She states she saw a little menstruation yesterday. She also reports thoracic back pain. She states she has had 3 pregnancies in the past and has had 3 abortions. She denies any medical problems. She denies syncope, vomiting, dysuria, vaginal discharge, fever/chills, flank pain, or hematuria.  Past Medical History  Diagnosis Date  . Hypertension    Past Surgical History  Procedure Laterality Date  . Cosmetic surgery      abdominal liposuction   Family History  Problem Relation Age of Onset  . Hypertension Mother    History  Substance Use Topics  . Smoking status: Never Smoker   . Smokeless tobacco: Never Used  . Alcohol Use: Yes     Comment: occassionally   OB History    Gravida Para Term Preterm AB TAB SAB Ectopic Multiple Living   0 0 0 0 0 0 0 0 0 0      Review of Systems  Gastrointestinal: Positive for abdominal pain. Negative for vomiting.  Genitourinary: Negative for dysuria, hematuria, flank pain and vaginal discharge.  Musculoskeletal: Positive for back pain.    10 systems reviewed  and found to be negative, except as noted in the HPI.   Allergies  Review of patient's allergies indicates no known allergies.  Home Medications   Prior to Admission medications   Medication Sig Start Date End Date Taking? Authorizing Provider  OVER THE COUNTER MEDICATION OTC allergy medications as needed.    Historical Provider, MD  Prenatal Vit-Fe Fumarate-FA (PRENATAL COMPLETE) 14-0.4 MG TABS Take 1 tablet by mouth daily. 10/25/14   Doyle Tegethoff, PA-C   BP 120/85 mmHg  Pulse 85  Temp(Src) 98.2 F (36.8 C) (Oral)  Resp 20  Ht 5\' 5"  (1.651 m)  Wt 140 lb (63.504 kg)  BMI 23.30 kg/m2  SpO2 100%  LMP  Physical Exam  Constitutional: She is oriented to person, place, and time. She appears well-developed and well-nourished. No distress.  HENT:  Head: Normocephalic and atraumatic.  Mouth/Throat: Oropharynx is clear and moist.  Eyes: Conjunctivae and EOM are normal. Pupils are equal, round, and reactive to light.  Neck: Normal range of motion.  Cardiovascular: Normal rate, regular rhythm and intact distal pulses.   Pulmonary/Chest: Effort normal and breath sounds normal. No stridor.  Abdominal: Soft. Bowel sounds are normal. She exhibits no distension and no mass. There is no tenderness. There is no rebound and no guarding.  Musculoskeletal: Normal range of motion.  Neurological: She is alert and oriented to person, place, and time.  Psychiatric: She has  a normal mood and affect.  Nursing note and vitals reviewed.   ED Course  Procedures (including critical care time)  DIAGNOSTIC STUDIES: Oxygen Saturation is 100% on room air, normal by my interpretation.    COORDINATION OF CARE: 9:32 PM Discussed with patient that her labs reveal evidence that she is pregnant. She states she intends to keep this pregnancy. She is G4 P0 A3. Discussed treatment plan with patient at beside, the patient agrees with the plan and has no further questions at this time.   Labs Review Labs  Reviewed  PREGNANCY, URINE - Abnormal; Notable for the following:    Preg Test, Ur POSITIVE (*)    All other components within normal limits  URINALYSIS, ROUTINE W REFLEX MICROSCOPIC - Abnormal; Notable for the following:    Hgb urine dipstick LARGE (*)    All other components within normal limits  HCG, QUANTITATIVE, PREGNANCY - Abnormal; Notable for the following:    hCG, Beta Chain, Quant, S 718 (*)    All other components within normal limits  CBC WITH DIFFERENTIAL/PLATELET - Abnormal; Notable for the following:    Hemoglobin 11.7 (*)    HCT 35.4 (*)    All other components within normal limits  COMPREHENSIVE METABOLIC PANEL - Abnormal; Notable for the following:    Sodium 133 (*)    Potassium 3.3 (*)    Glucose, Bld 148 (*)    GFR calc non Af Amer 90 (*)    Anion gap 3 (*)    All other components within normal limits  URINE MICROSCOPIC-ADD ON  ABO/RH    Imaging Review US Ob Comp Less 14 Wks  10/24/2014   CLINICAL DATA:  Acute onset of diffuse pelvic pain and vaginal bleeding. Initial encounter.  EXAM: OBSTETRIC <14 WK [place] AND TRANSVAGINAL OB US  TECHNIQUE: Both transabdominal and transvaginal ultrasound examinations were performed for complete evaluation of the gestation as well as the maternal uterus, adnexal regions, and pelvic cul-de-sac. Transvaginal technique was performed to assess early pregnancy.  COMPARISON:  None.  FINDINGS: Intrauterine gestational sac: Visualized/normal in shape.  Yolk sac:  No  Embryo:  No  Cardiac Activity: N/A  MSD: 2.1 mm   4 w   6  d  Maternal uterus/adnexae: No subchorionic hemorrhage is noted. Two subserosal fibroids are seen at the left side of the uterus, measuring 3.5 x 1.5 x 3.1 cm laterally near the fundus, and 2.3 x 1.4 x 2.1 cm posteriorly.  A septated cyst with internal echoes is noted at the right ovary, measuring 2.1 x 1.6 x 1.8 cm. This may reflect a hemorrhagic cyst. The left ovary is unremarkable in appearance. The right ovary measures  4.4 x 2.3 x 3.2 cm, while the left ovary measures 3.4 x 1.8 x 2.3 cm.  Trace free fluid is seen at the left adnexa.  IMPRESSION: 1. Single intrauterine gestational sac noted, measuring 2 mm, corresponding to a gestational age of [redacted] weeks 6 days. This matches the gestational age of [redacted] weeks 5 days by LMP, reflecting an estimated date of delivery of June 28, 2015. 2. Question of small 2.1 cm hemorrhagic cyst at the right ovary. 3. Two subserosal left-sided uterine fibroids noted, measuring up to 3.5 cm in size.   Electronically Signed   By: Garald Balding M.D.   On: 10/24/2014 22:46   US Ob Transvaginal  10/24/2014   CLINICAL DATA:  Acute onset of diffuse pelvic pain and vaginal bleeding. Initial encounter.  EXAM: OBSTETRIC <14  WK [place] AND TRANSVAGINAL OB US  TECHNIQUE: Both transabdominal and transvaginal ultrasound examinations were performed for complete evaluation of the gestation as well as the maternal uterus, adnexal regions, and pelvic cul-de-sac. Transvaginal technique was performed to assess early pregnancy.  COMPARISON:  None.  FINDINGS: Intrauterine gestational sac: Visualized/normal in shape.  Yolk sac:  No  Embryo:  No  Cardiac Activity: N/A  MSD: 2.1 mm   4 w   6  d  Maternal uterus/adnexae: No subchorionic hemorrhage is noted. Two subserosal fibroids are seen at the left side of the uterus, measuring 3.5 x 1.5 x 3.1 cm laterally near the fundus, and 2.3 x 1.4 x 2.1 cm posteriorly.  A septated cyst with internal echoes is noted at the right ovary, measuring 2.1 x 1.6 x 1.8 cm. This may reflect a hemorrhagic cyst. The left ovary is unremarkable in appearance. The right ovary measures 4.4 x 2.3 x 3.2 cm, while the left ovary measures 3.4 x 1.8 x 2.3 cm.  Trace free fluid is seen at the left adnexa.  IMPRESSION: 1. Single intrauterine gestational sac noted, measuring 2 mm, corresponding to a gestational age of [redacted] weeks 6 days. This matches the gestational age of [redacted] weeks 5 days by LMP, reflecting an  estimated date of delivery of June 28, 2015. 2. Question of small 2.1 cm hemorrhagic cyst at the right ovary. 3. Two subserosal left-sided uterine fibroids noted, measuring up to 3.5 cm in size.   Electronically Signed   By: Garald Balding M.D.   On: 10/24/2014 22:46     EKG Interpretation None      MDM   Final diagnoses:  Pregnancy  First-trimester bleeding    Filed Vitals:   10/24/14 1941 10/25/14 0036  BP: 128/78 120/85  Pulse: 88 85  Temp: 99.1 F (37.3 C) 98.2 F (36.8 C)  TempSrc: Oral   Resp: 18 20  Height: 5\' 5"  (1.651 m)   Weight: 140 lb (63.504 kg)   SpO2: 100% 100%    Medications  sodium chloride 0.9 % bolus 1,000 mL (0 mLs Intravenous Stopped 10/25/14 0010)  acetaminophen (TYLENOL) tablet 1,000 mg (1,000 mg Oral Given 10/24/14 2250)    Mariah Livingston is a pleasant 34 y.o. female presenting with vaginal bleeding, low back pain and resolved lower abd pain. Urine pregnancy is positive, Quant 718, O positive, [place] with intrauterine gestational sac. Will start prenatal vitamins, give ob/gyn referral and instruct in how to obtain emergency medicaid,   Evaluation does not show pathology that would require ongoing emergent intervention or inpatient treatment. Pt is hemodynamically stable and mentating appropriately. Discussed findings and plan with patient/guardian, who agrees with care plan. All questions answered. Return precautions discussed and outpatient follow up given.   Discharge Medication List as of 10/25/2014 12:26 AM    START taking these medications   Details  Prenatal Vit-Fe Fumarate-FA (PRENATAL COMPLETE) 14-0.4 MG TABS Take 1 tablet by mouth daily., Starting 10/25/2014, Until Discontinued, Print        I personally performed the services described in this documentation, which was scribed in my presence.  The recorded information has been reviewed and is accurate.   Monico Blitz, PA-C 10/25/14 [place] Alvino Chapel, MD 10/26/14 2129

## 2014-10-25 LAB — ABO/RH: ABO/RH(D): O POS

## 2014-10-25 MED ORDER — PRENATAL COMPLETE 14-0.4 MG PO TABS: 1.0000 | ORAL_TABLET | Freq: Every day | ORAL | Status: AC

## 2014-10-25 NOTE — Discharge Instructions (Signed)
Stop Taking the hydrochlorothiazide for her blood pressure, as we have discussed.  Follow with OB/GYN as soon as possible.   Do NOT take any NSAIDs, such as Aspirin, Motrin, Ibuprofen, Aleve, Naproxen etc. Only take Tylenol for pain. Return to the emergency room  for any severe abdominal pain, increasing vaginal bleeding, passing out or repeated vomiting.   Obtain over-the-counter prenatal vitamins. Read the label and make sure that they have at least 400 mcg of folate acid.   Perform pelvic rest until your cleared by OB/GYN.  This means no vaginal intercourse or tampon use.  You may experience vaginal bleeding up to one pad an hour if the bleeding is more than this, please present immediately to [facility] for evaluation. You may also experience abdominal cramping please take Percocet for cramping pain is severe also present to [facility] for evaluation. If you develop a fever or any other concerning symptoms please present for a recheck at [facility].  Please go to the [facility] office in [facility] to apply for coverage. Alternatively, you can could go to the [place] office in [facility] to apply for emergency coverage.

## 2014-11-01 ENCOUNTER — Telehealth: Payer: Self-pay | Admitting: Medical

## 2014-11-01 NOTE — Telephone Encounter (Signed)
Left message for patient to call.

## 2014-11-01 NOTE — Telephone Encounter (Signed)
I tried to call pt to see if she has already seen ob Dr. She has history of htn, on med, and was seen by ED. I reviewed that note. I called and she only has one number. If she has not seen Ob yet then I would refer for her. Will ask lpn I work with to send letter to her asking her to call us.

## 2014-11-02 NOTE — Telephone Encounter (Signed)
Left another message for patient to call.

## 2014-11-29 LAB — OB RESULTS CONSOLE HIV ANTIBODY (ROUTINE TESTING): HIV: NONREACTIVE

## 2014-11-29 LAB — SICKLE CELL SCREEN: Sickle Cell Screen: NORMAL

## 2014-11-29 LAB — OB RESULTS CONSOLE RUBELLA ANTIBODY, IGM: Rubella: IMMUNE

## 2014-11-29 LAB — OB RESULTS CONSOLE HEPATITIS B SURFACE ANTIGEN: Hepatitis B Surface Ag: NEGATIVE

## 2014-12-01 ENCOUNTER — Encounter: Payer: Self-pay | Admitting: *Deleted

## 2014-12-10 ENCOUNTER — Emergency Department (HOSPITAL_BASED_OUTPATIENT_CLINIC_OR_DEPARTMENT_OTHER)
Admission: EM | Admit: 2014-12-10 | Discharge: 2014-12-10 | Disposition: A | Discharge status: Home / Self Care | Payer: Medicaid Other | Attending: Emergency Medicine | Admitting: Emergency Medicine

## 2014-12-10 ENCOUNTER — Encounter (HOSPITAL_BASED_OUTPATIENT_CLINIC_OR_DEPARTMENT_OTHER): Payer: Self-pay | Admitting: Emergency Medicine

## 2014-12-10 DIAGNOSIS — Z0000 Encounter for general adult medical examination without abnormal findings: Secondary | ICD-10-CM

## 2014-12-10 DIAGNOSIS — Z79899 Other long term (current) drug therapy: Secondary | ICD-10-CM | POA: Diagnosis not present

## 2014-12-10 DIAGNOSIS — Z3202 Encounter for pregnancy test, result negative: Secondary | ICD-10-CM | POA: Insufficient documentation

## 2014-12-10 DIAGNOSIS — I10 Essential (primary) hypertension: Secondary | ICD-10-CM | POA: Diagnosis not present

## 2014-12-10 DIAGNOSIS — Z8759 Personal history of other complications of pregnancy, childbirth and the puerperium: Secondary | ICD-10-CM

## 2014-12-10 LAB — PREGNANCY, URINE: Preg Test, Ur: NEGATIVE

## 2014-12-10 NOTE — Discharge Instructions (Signed)
Keep your appointment at [facility] to discuss future pregnancy.

## 2014-12-10 NOTE — ED Notes (Signed)
Pt reports she is here to make sure her baby is ok.  States she is [redacted] weeks gestation, OBGYN appt is 4/25, no specific complaints today.

## 2014-12-10 NOTE — ED Provider Notes (Signed)
CSN: [id number]     Arrival date & time 12/10/14  1027 History   First MD Initiated Contact with Patient 12/10/14 1034     Chief Complaint  Patient presents with  . Wants to make sure her baby is ok- no complaints       HPI  Isn't presents for evaluation with no specific complaints. States "I want to make sure my baby is okay". Seen and evaluated 2-28 with positive hCG. Discharge Saturday and had 5 days of additional bleeding. Did not see anyone in follow-up. Again in March had another 5 days of bleeding. Presents here now mid-April. No bleeding no pain. Has a GYN appointment in 1 week. States she wanted to "make sure my baby is okay. No bleeding no pain no nausea or other symptoms. One prior pregnancy.  Past Medical History  Diagnosis Date  . Hypertension    Past Surgical History  Procedure Laterality Date  . Cosmetic surgery      abdominal liposuction   Family History  Problem Relation Age of Onset  . Hypertension Mother    History  Substance Use Topics  . Smoking status: Never Smoker   . Smokeless tobacco: Never Used  . Alcohol Use: Yes     Comment: occassionally   OB History    Gravida Para Term Preterm AB TAB SAB Ectopic Multiple Living   4 0 0 0 3 3 0 0 0 0      Review of Systems  Constitutional: Negative for fever, chills, diaphoresis, appetite change and fatigue.  HENT: Negative for mouth sores, sore throat and trouble swallowing.   Eyes: Negative for visual disturbance.  Respiratory: Negative for cough, chest tightness, shortness of breath and wheezing.   Cardiovascular: Negative for chest pain.  Gastrointestinal: Negative for nausea, vomiting, abdominal pain, diarrhea and abdominal distention.  Endocrine: Negative for polydipsia, polyphagia and polyuria.  Genitourinary: Negative for dysuria, frequency and hematuria.  Musculoskeletal: Negative for gait problem.  Skin: Negative for color change, pallor and rash.  Neurological: Negative for dizziness, syncope,  light-headedness and headaches.  Hematological: Does not bruise/bleed easily.  Psychiatric/Behavioral: Negative for behavioral problems and confusion.      Allergies  Review of patient's allergies indicates no known allergies.  Home Medications   Prior to Admission medications   Medication Sig Start Date End Date Taking? Authorizing Provider  OVER THE COUNTER MEDICATION OTC allergy medications as needed.    Historical Provider, MD  Prenatal Vit-Fe Fumarate-FA (PRENATAL COMPLETE) 14-0.4 MG TABS Take 1 tablet by mouth daily. 10/25/14   Nicole Pisciotta, PA-C   BP 127/75 mmHg  Pulse 79  Temp(Src) 98.3 F (36.8 C) (Oral)  Resp 18  SpO2 100%  LMP 09/21/2014 (Exact Date) Physical Exam  Constitutional: She is oriented to person, place, and time. She appears well-developed and well-nourished. No distress.  HENT:  Head: Normocephalic.  Eyes: Conjunctivae are normal. Pupils are equal, round, and reactive to light. No scleral icterus.  Neck: Normal range of motion. Neck supple. No thyromegaly present.  Cardiovascular: Normal rate and regular rhythm.  Exam reveals no gallop and no friction rub.   No murmur heard. Pulmonary/Chest: Effort normal and breath sounds normal. No respiratory distress. She has no wheezes. She has no rales.  Abdominal: Soft. Bowel sounds are normal. She exhibits no distension. There is no tenderness. There is no rebound.  Musculoskeletal: Normal range of motion.  Neurological: She is alert and oriented to person, place, and time.  Skin: Skin is warm and dry.  No rash noted.  Psychiatric: She has a normal mood and affect. Her behavior is normal.    ED Course  Procedures (including critical care time) Labs Review Labs Reviewed  PREGNANCY, URINE    Imaging Review No results found.   EKG Interpretation None      MDM   Final diagnoses:  Normal physical exam  H/O miscarriage, not currently pregnant    HCG negative here. I think she has miscarried and  is currently asymptomatic. [facility].    Tanna Furry, MD 12/10/14 1128

## 2014-12-10 NOTE — ED Notes (Signed)
MD at bedside with bedside ultrasound.

## 2014-12-13 ENCOUNTER — Telehealth: Payer: Self-pay | Admitting: *Deleted

## 2014-12-13 NOTE — Telephone Encounter (Signed)
Pt contacted the clinic, states Dr. Robin Searing told her that she is not pregnant but to keep New OB appointment at the clinic.  Requested ultrasound from office before cancelling appointment.  If not pregnant will reschedule appointment to another morning per patient request.

## 2014-12-14 NOTE — Telephone Encounter (Signed)
Called patient and discussed recent visit to ED/negative pregnancy test. Informed her we would cancel appointment for Monday and bring her in for GYN visit to discuss SAB and future pregnancies. Message sent to admin pool to schedule patient GYN appointment and cancel OB appointment. Patient verbalized understanding to plan. No further questions or concerns.

## 2014-12-15 ENCOUNTER — Encounter: Payer: Self-pay | Admitting: *Deleted

## 2014-12-20 ENCOUNTER — Encounter: Payer: Self-pay | Admitting: Obstetrics and Gynecology

## 2014-12-22 ENCOUNTER — Telehealth: Payer: Self-pay | Admitting: *Deleted

## 2014-12-22 NOTE — Telephone Encounter (Signed)
Pt contacted the clinic gave no information on message.  Attempted to contact patient, no answer, left message for patient that we are returning her call and for her to contact clinic.

## 2014-12-23 NOTE — Telephone Encounter (Signed)
Called patient at both numbers, no answer- left message on both that we are trying to return her call from the other day, if you still need assistance please call us back at the clinics

## 2014-12-31 ENCOUNTER — Ambulatory Visit: Payer: Medicaid Other | Admitting: Family Medicine

## 2015-01-05 ENCOUNTER — Ambulatory Visit (INDEPENDENT_AMBULATORY_CARE_PROVIDER_SITE_OTHER): Payer: Medicaid Other | Admitting: Obstetrics and Gynecology

## 2015-01-05 ENCOUNTER — Encounter: Payer: Self-pay | Admitting: Obstetrics and Gynecology

## 2015-01-05 VITALS — BP 125/83 | HR 79 | Temp 98.6°F | Ht 66.0 in | Wt 157.7 lb

## 2015-01-05 DIAGNOSIS — O039 Complete or unspecified spontaneous abortion without complication: Secondary | ICD-10-CM

## 2015-01-05 NOTE — Progress Notes (Signed)
Patient ID: Mariah Livingston, female   DOB: 02-May-1981, 34 y.o.   MRN: [id number] 34 yo [id number] presenting today as an ED follow up for a spontaneous miscarriage. Patient had a positive pregnancy test in February and reports some vaginal bleeding in March and April. She was seen in April in the ED due to persistent vaginal bleeding and a negative pregnancy test was obtained. She describes her bleeding in March as heavy whereas her April episode of vaginal bleeding was more consistent with her period. Patient is doing well currently and is without complaints. This was not a planned pregnancy but she is not interested in contraception right now.  Past Medical History  Diagnosis Date  . Hypertension    Past Surgical History  Procedure Laterality Date  . Cosmetic surgery      abdominal liposuction   Family History  Problem Relation Age of Onset  . Hypertension Mother    History  Substance Use Topics  . Smoking status: Never Smoker   . Smokeless tobacco: Never Used  . Alcohol Use: Yes     Comment: occassionally   ROS See HPI for pertinent  Filed Vitals:   01/05/15 1400  BP: 125/83  Pulse: 79  Temp: 98.6 F (37 C)     GENERAL: Well-developed, well-nourished female in no acute distress.  ABDOMEN: Soft, nontender, nondistended. No organomegaly. PELVIC: Normal external female genitalia. Vagina is pink and rugated.  Normal discharge. Normal appearing cervix. Uterus is normal in size. No adnexal mass or tenderness. EXTREMITIES: No cyanosis, clubbing, or edema, 2+ distal pulses.  A/P 34 yo s/p SAB - Patient is not interested in contraception and would welcome a pregnancy if it occurred - Patient with h/o HTN currently not on medication. Patient was normotensive in February, April and today. Will not restart any antihypertensive at this time - Encouraged patient to take prenatal vitamins now and to return for prenatal appointment or annual exam

## 2015-04-14 ENCOUNTER — Telehealth: Payer: Self-pay | Admitting: *Deleted

## 2015-04-14 NOTE — Telephone Encounter (Signed)
Received message left on nurse line on 04/14/15 at 1352.  Patient left no message other than name and date of birth.

## 2015-04-21 NOTE — Telephone Encounter (Signed)
Called patient stating I am returning her phone call. Patient states she had a miscarriage in May and has been trying to get pregnant since then and needs to know when she is ovulating. Told patient that there isn't an exact date I can give her for ovulation as all women are different. Recommended she keep track of her period through an app on her phone that tracks her menstrual cycle and that can also show her ovulation days. Also discussed with patient that it can take a while to get pregnant, even for some women up to a year. Patient verbalized understanding and had no other questions

## 2015-05-03 ENCOUNTER — Encounter (HOSPITAL_BASED_OUTPATIENT_CLINIC_OR_DEPARTMENT_OTHER): Payer: Self-pay

## 2015-05-03 ENCOUNTER — Emergency Department (HOSPITAL_BASED_OUTPATIENT_CLINIC_OR_DEPARTMENT_OTHER)
Admission: EM | Admit: 2015-05-03 | Discharge: 2015-05-03 | Disposition: A | Discharge status: Home / Self Care | Payer: Medicaid Other | Attending: Emergency Medicine | Admitting: Emergency Medicine

## 2015-05-03 ENCOUNTER — Emergency Department (HOSPITAL_BASED_OUTPATIENT_CLINIC_OR_DEPARTMENT_OTHER): Payer: Medicaid Other

## 2015-05-03 DIAGNOSIS — I10 Essential (primary) hypertension: Secondary | ICD-10-CM | POA: Insufficient documentation

## 2015-05-03 DIAGNOSIS — N852 Hypertrophy of uterus: Secondary | ICD-10-CM | POA: Insufficient documentation

## 2015-05-03 DIAGNOSIS — R102 Pelvic and perineal pain: Secondary | ICD-10-CM

## 2015-05-03 DIAGNOSIS — Z3202 Encounter for pregnancy test, result negative: Secondary | ICD-10-CM | POA: Insufficient documentation

## 2015-05-03 HISTORY — DX: Complete or unspecified spontaneous abortion without complication: O03.9

## 2015-05-03 LAB — URINALYSIS, ROUTINE W REFLEX MICROSCOPIC
BILIRUBIN URINE: NEGATIVE
GLUCOSE, UA: NEGATIVE mg/dL
HGB URINE DIPSTICK: NEGATIVE
Ketones, ur: NEGATIVE mg/dL
Leukocytes, UA: NEGATIVE
Nitrite: NEGATIVE
PH: 5 (ref 5.0–8.0)
Protein, ur: 30 mg/dL — AB
SPECIFIC GRAVITY, URINE: 1.027 (ref 1.005–1.030)
Urobilinogen, UA: 0.2 mg/dL (ref 0.0–1.0)

## 2015-05-03 LAB — WET PREP, GENITAL
TRICH WET PREP: NONE SEEN
Yeast Wet Prep HPF POC: NONE SEEN

## 2015-05-03 LAB — URINE MICROSCOPIC-ADD ON

## 2015-05-03 LAB — PREGNANCY, URINE: Preg Test, Ur: NEGATIVE

## 2015-05-03 NOTE — ED Notes (Signed)
Patient transported to Ultrasound 

## 2015-05-03 NOTE — ED Notes (Signed)
C/o abd pain since miscarriage in Feb-states she was seen at [facility] steady gait-NAD

## 2015-05-03 NOTE — ED Provider Notes (Signed)
CSN: [id number]     Arrival date & time 05/03/15  1307 History   First MD Initiated Contact with Patient 05/03/15 1500     Chief Complaint  Patient presents with  . Abdominal Pain     (Consider location/radiation/quality/duration/timing/severity/associated sxs/prior Treatment) HPI Comments: 34 year old female complaining of lower abdominal/pelvic pain 2 weeks. Pain described as pressure, nonradiating, constant. Nothing in specific makes her pain worse or better. Reports having a miscarriage in February and did not have any problems since. Had her follow-up at [facility] hospital shortly after the miscarriage. Denies fever, chills, nausea, vomiting, vaginal bleeding, discharge or urinary symptoms. LMP 04/21/2015 and was normal. She is sexually active with one female partner and denies dyspareunia.  Patient is a 34 y.o. female presenting with abdominal pain. The history is provided by the patient.  Abdominal Pain   Past Medical History  Diagnosis Date  . Hypertension   . Miscarriage    Past Surgical History  Procedure Laterality Date  . Cosmetic surgery      abdominal liposuction   Family History  Problem Relation Age of Onset  . Hypertension Mother    Social History  Substance Use Topics  . Smoking status: Never Smoker   . Smokeless tobacco: Never Used  . Alcohol Use: Yes     Comment: occassionally   OB History    Gravida Para Term Preterm AB TAB SAB Ectopic Multiple Living   4 0 0 0 3 3 0 0 0 0      Review of Systems  Gastrointestinal: Positive for abdominal pain.  Genitourinary: Positive for pelvic pain.  All other systems reviewed and are negative.     Allergies  Review of patient's allergies indicates no known allergies.  Home Medications   Prior to Admission medications   Medication Sig Start Date End Date Taking? Authorizing Provider  HYDRALAZINE-HCTZ PO Take by mouth.   Yes Historical Provider, MD  OVER THE COUNTER MEDICATION OTC allergy medications as  needed.    Historical Provider, MD  Prenatal Vit-Fe Fumarate-FA (PRENATAL COMPLETE) 14-0.4 MG TABS Take 1 tablet by mouth daily. Patient not taking: Reported on 01/05/2015 10/25/14   Elmyra Ricks Pisciotta, PA-C   BP 106/76 mmHg  Pulse 72  Temp(Src) 97.7 F (36.5 C) (Oral)  Resp 18  Ht 5\' 6"  (1.676 m)  Wt 160 lb (72.576 kg)  BMI 25.84 kg/m2  SpO2 100%  LMP 04/21/2015 Physical Exam  Constitutional: She is oriented to person, place, and time. She appears well-developed and well-nourished. No distress.  HENT:  Head: Normocephalic and atraumatic.  Mouth/Throat: Oropharynx is clear and moist.  Eyes: Conjunctivae and EOM are normal.  Neck: Normal range of motion. Neck supple.  Cardiovascular: Normal rate, regular rhythm and normal heart sounds.   Pulmonary/Chest: Effort normal and breath sounds normal. No respiratory distress.  Abdominal: Normal appearance. She exhibits no distension. There is tenderness in the suprapubic area. There is no rigidity, no rebound, no guarding and no CVA tenderness.  No peritoneal signs.  Genitourinary: Uterus is not tender. Cervix exhibits no motion tenderness and no discharge. Right adnexum displays no mass and no tenderness. Left adnexum displays no mass and no tenderness. No bleeding in the vagina. No vaginal discharge found.  Musculoskeletal: Normal range of motion. She exhibits no edema.  Neurological: She is alert and oriented to person, place, and time. No sensory deficit.  Skin: Skin is warm and dry.  Psychiatric: She has a normal mood and affect. Her behavior is normal.  Nursing note  and vitals reviewed.   ED Course  Procedures (including critical care time) Labs Review Labs Reviewed  WET PREP, GENITAL - Abnormal; Notable for the following:    Clue Cells Wet Prep HPF POC FEW (*)    WBC, Wet Prep HPF POC FEW (*)    All other components within normal limits  URINALYSIS, ROUTINE W REFLEX MICROSCOPIC (NOT AT [facility]) - Abnormal; Notable for the following:     APPearance CLOUDY (*)    Protein, ur 30 (*)    All other components within normal limits  PREGNANCY, URINE  URINE MICROSCOPIC-ADD ON  GC/CHLAMYDIA PROBE AMP ([place]) NOT AT [facility]    Imaging Review US Transvaginal Non-ob  05/03/2015   CLINICAL DATA:  34 year old female with generalized pelvic pain for 2 weeks. Negative urine pregnancy test. LMP 04/21/2015, day 13 of menstrual cycle.  EXAM: TRANSABDOMINAL AND TRANSVAGINAL ULTRASOUND OF PELVIS  TECHNIQUE: Both transabdominal and transvaginal ultrasound examinations of the pelvis were performed. Transabdominal technique was performed for global imaging of the pelvis including uterus, ovaries, adnexal regions, and pelvic cul-de-sac. It was necessary to proceed with endovaginal exam following the transabdominal exam to visualize the endometrium, myometrium, ovaries and adnexa.  COMPARISON:  10/24/2014 obstetric scan.  FINDINGS: Uterus  Measurements: 8.1 x 3.9 x 5.1 cm. The anteverted anteflexed uterus is mildly enlarged. There are uterine fibroids as follows:  - anterior lower uterine segment subserosal 2.2 x 2.1 x 2.4 cm fibroid  - right anterior uterine body subserosal 1.3 x 1.2 x 1.3 cm fibroid  - posterior fundal subserosal 1.5 x 1.2 x 1.7 cm fibroid  Endometrium  Thickness: 10 mm.  No focal abnormality visualized.  Right ovary  Measurements: 4.2 x 2.3 x 2.3 cm. There is an involuting 1.5 cm right ovarian corpus luteum. No suspicious right ovarian or right adnexal masses.  Left ovary  Measurements: 3.9 x 2.7 x 2.2 cm. Normal appearance/no adnexal mass.  Other findings  Small volume simple free fluid in the right adnexa and pelvic cul-de-sac.  IMPRESSION: 1. Mildly enlarged myomatous uterus. No submucosal or intracavitary fibroids. 2. No endometrial abnormality. 3. No suspicious ovarian or adnexal findings. Involuting right ovarian corpus luteum with nonspecific small volume simple free fluid in the right adnexa and pelvic cul-de-sac, which is likely  physiologic.   Electronically Signed   By: Ilona Sorrel M.D.   On: 05/03/2015 16:09   US Pelvis Complete  05/03/2015   CLINICAL DATA:  34 year old female with generalized pelvic pain for 2 weeks. Negative urine pregnancy test. LMP 04/21/2015, day 13 of menstrual cycle.  EXAM: TRANSABDOMINAL AND TRANSVAGINAL ULTRASOUND OF PELVIS  TECHNIQUE: Both transabdominal and transvaginal ultrasound examinations of the pelvis were performed. Transabdominal technique was performed for global imaging of the pelvis including uterus, ovaries, adnexal regions, and pelvic cul-de-sac. It was necessary to proceed with endovaginal exam following the transabdominal exam to visualize the endometrium, myometrium, ovaries and adnexa.  COMPARISON:  10/24/2014 obstetric scan.  FINDINGS: Uterus  Measurements: 8.1 x 3.9 x 5.1 cm. The anteverted anteflexed uterus is mildly enlarged. There are uterine fibroids as follows:  - anterior lower uterine segment subserosal 2.2 x 2.1 x 2.4 cm fibroid  - right anterior uterine body subserosal 1.3 x 1.2 x 1.3 cm fibroid  - posterior fundal subserosal 1.5 x 1.2 x 1.7 cm fibroid  Endometrium  Thickness: 10 mm.  No focal abnormality visualized.  Right ovary  Measurements: 4.2 x 2.3 x 2.3 cm. There is an involuting 1.5 cm right ovarian  corpus luteum. No suspicious right ovarian or right adnexal masses.  Left ovary  Measurements: 3.9 x 2.7 x 2.2 cm. Normal appearance/no adnexal mass.  Other findings  Small volume simple free fluid in the right adnexa and pelvic cul-de-sac.  IMPRESSION: 1. Mildly enlarged myomatous uterus. No submucosal or intracavitary fibroids. 2. No endometrial abnormality. 3. No suspicious ovarian or adnexal findings. Involuting right ovarian corpus luteum with nonspecific small volume simple free fluid in the right adnexa and pelvic cul-de-sac, which is likely physiologic.   Electronically Signed   By: Ilona Sorrel M.D.   On: 05/03/2015 16:09   I have personally reviewed and evaluated  these images and lab results as part of my medical decision-making.   EKG Interpretation None      MDM   Final diagnoses:  Pelvic pain in female  Enlarged uterus   Non-toxic appearing, NAD. AFVSS. Abdomen soft with no peritoneal signs. No associated symptoms. No right lower quadrant tenderness concerning for appendicitis. Low suspicion for ovarian torsion or infectious process. Pelvic exam without discharge, CMT or adnexal tenderness. Ultrasound obtained given history of miscarriage with new pelvic pain, results as stated above. Advised the patient to follow-up with OB/GYN. NSAIDs for pain. Stable for discharge. Return precautions given. Patient states understanding of treatment care plan and is agreeable.  Carman Ching, PA-C 05/03/15 [place], MD 05/04/15 [phone]

## 2015-05-03 NOTE — Discharge Instructions (Signed)
You may take over-the-counter medications such as ibuprofen, naproxen or Tylenol for your pain. Follow-up with OB/GYN.   Pelvic Pain Female pelvic pain can be caused by many different things and start from a variety of places. Pelvic pain refers to pain that is located in the lower half of the abdomen and between your hips. The pain may occur over a short period of time (acute) or may be reoccurring (chronic). The cause of pelvic pain may be related to disorders affecting the female reproductive organs (gynecologic), but it may also be related to the bladder, kidney stones, an intestinal complication, or muscle or skeletal problems. Getting help right away for pelvic pain is important, especially if there has been severe, sharp, or a sudden onset of unusual pain. It is also important to get help right away because some types of pelvic pain can be life threatening.  CAUSES  Below are only some of the causes of pelvic pain. The causes of pelvic pain can be in one of several categories.   Gynecologic.  Pelvic inflammatory disease.  Sexually transmitted infection.  Ovarian cyst or a twisted ovarian ligament (ovarian torsion).  Uterine lining that grows outside the uterus (endometriosis).  Fibroids, cysts, or tumors.  Ovulation.  Pregnancy.  Pregnancy that occurs outside the uterus (ectopic pregnancy).  Miscarriage.  Labor.  Abruption of the placenta or ruptured uterus.  Infection.  Uterine infection (endometritis).  Bladder infection.  Diverticulitis.  Miscarriage related to a uterine infection (septic abortion).  Bladder.  Inflammation of the bladder (cystitis).  Kidney stone(s).  Gastrointestinal.  Constipation.  Diverticulitis.  Neurologic.  Trauma.  Feeling pelvic pain because of mental or emotional causes (psychosomatic).  Cancers of the bowel or pelvis. EVALUATION  Your caregiver will want to take a careful history of your concerns. This includes recent  changes in your health, a careful gynecologic history of your periods (menses), and a sexual history. Obtaining your family history and medical history is also important. Your caregiver may suggest a pelvic exam. A pelvic exam will help identify the location and severity of the pain. It also helps in the evaluation of which organ system may be involved. In order to identify the cause of the pelvic pain and be properly treated, your caregiver may order tests. These tests may include:   A pregnancy test.  Pelvic ultrasonography.  An X-ray exam of the abdomen.  A urinalysis or evaluation of vaginal discharge.  Blood tests. HOME CARE INSTRUCTIONS   Only take over-the-counter or prescription medicines for pain, discomfort, or fever as directed by your caregiver.   Rest as directed by your caregiver.   Eat a balanced diet.   Drink enough fluids to make your urine clear or pale yellow, or as directed.   Avoid sexual intercourse if it causes pain.   Apply warm or cold compresses to the lower abdomen depending on which one helps the pain.   Avoid stressful situations.   Keep a journal of your pelvic pain. Write down when it started, where the pain is located, and if there are things that seem to be associated with the pain, such as food or your menstrual cycle.  Follow up with your caregiver as directed.  SEEK MEDICAL CARE IF:  Your medicine does not help your pain.  You have abnormal vaginal discharge. SEEK IMMEDIATE MEDICAL CARE IF:   You have heavy bleeding from the vagina.   Your pelvic pain increases.   You feel light-headed or faint.   You have  chills.   You have pain with urination or blood in your urine.   You have uncontrolled diarrhea or vomiting.   You have a fever or persistent symptoms for more than 3 days.  You have a fever and your symptoms suddenly get worse.   You are being physically or sexually abused.  MAKE SURE YOU:  Understand these  instructions.  Will watch your condition.  Will get help if you are not doing well or get worse. Document Released: 07/10/2004 Document Revised: 12/28/2013 Document Reviewed: 12/03/2011 [facility] Patient Information 2015 [place], [place]. This information is not intended to replace advice given to you by your health care provider. Make sure you discuss any questions you have with your health care provider.

## 2015-05-04 LAB — GC/CHLAMYDIA PROBE AMP (~~LOC~~) NOT AT ARMC
CHLAMYDIA, DNA PROBE: NEGATIVE
NEISSERIA GONORRHEA: NEGATIVE

## 2015-06-03 ENCOUNTER — Encounter: Payer: Self-pay | Admitting: Obstetrics and Gynecology

## 2015-06-03 ENCOUNTER — Ambulatory Visit (INDEPENDENT_AMBULATORY_CARE_PROVIDER_SITE_OTHER): Payer: Self-pay | Admitting: Obstetrics and Gynecology

## 2015-06-03 VITALS — BP 116/81 | HR 70 | Temp 98.5°F | Ht 67.0 in | Wt 164.5 lb

## 2015-06-03 DIAGNOSIS — Z09 Encounter for follow-up examination after completed treatment for conditions other than malignant neoplasm: Secondary | ICD-10-CM

## 2015-06-03 DIAGNOSIS — R102 Pelvic and perineal pain: Secondary | ICD-10-CM

## 2015-06-03 NOTE — Progress Notes (Signed)
   Subjective:    Patient ID: Mariah Livingston, female    DOB: 1981-01-04, 34 y.o.   MRN: [id number]  HPI  34 yo [id number] presenting today as an ED follow up for the evaluation of pelvic pain. Patient reports feeling well since her ED visit and denies any abdominal/pelvic pain. Patient desires to conceive  Past Medical History  Diagnosis Date  . Hypertension   . Miscarriage    Past Surgical History  Procedure Laterality Date  . Cosmetic surgery      abdominal liposuction   Family History  Problem Relation Age of Onset  . Hypertension Mother    Social History  Substance Use Topics  . Smoking status: Never Smoker   . Smokeless tobacco: Never Used  . Alcohol Use: Yes     Comment: occassionally     Review of Systems See pertinent in HPI    Objective:   Physical Exam GENERAL: Well-developed, well-nourished female in no acute distress.  ABDOMEN: Soft, nontender, nondistended. No organomegaly. PELVIC: Normal external female genitalia. Vagina is pink and rugated.  Normal discharge. Normal appearing cervix. Uterus is normal in size. No adnexal mass or tenderness. EXTREMITIES: No cyanosis, clubbing, or edema, 2+ distal pulses.  05/03/2015 Ultrasound FINDINGS: Uterus  Measurements: 8.1 x 3.9 x 5.1 cm. The anteverted anteflexed uterus is mildly enlarged. There are uterine fibroids as follows:  - anterior lower uterine segment subserosal 2.2 x 2.1 x 2.4 cm fibroid  - right anterior uterine body subserosal 1.3 x 1.2 x 1.3 cm fibroid  - posterior fundal subserosal 1.5 x 1.2 x 1.7 cm fibroid  Endometrium  Thickness: 10 mm. No focal abnormality visualized.  Right ovary  Measurements: 4.2 x 2.3 x 2.3 cm. There is an involuting 1.5 cm right ovarian corpus luteum. No suspicious right ovarian or right adnexal masses.  Left ovary  Measurements: 3.9 x 2.7 x 2.2 cm. Normal appearance/no adnexal mass.  Other findings  Small volume simple free fluid in the right  adnexa and pelvic cul-de-sac.  IMPRESSION: 1. Mildly enlarged myomatous uterus. No submucosal or intracavitary fibroids. 2. No endometrial abnormality. 3. No suspicious ovarian or adnexal findings. Involuting right ovarian corpus luteum with nonspecific small volume simple free fluid in the right adnexa and pelvic cul-de-sac, which is likely physiologic.   Electronically Signed  By: Ilona Sorrel M.D.  On: 05/03/2015 16:09      Assessment & Plan:  34 yo  - Ultrasound results reviewed.  - Discussed optimal time for intercourse  - Continue prenatal vitamins - RTC prn

## 2015-06-03 NOTE — Progress Notes (Signed)
Here today for pelvic pain.  States was expecting 05/24/15 , but had a lot of bleeding on 05/09/15. States she wonders if she had a miscarriage then.

## 2015-08-24 ENCOUNTER — Telehealth: Payer: Self-pay | Admitting: Behavioral Health

## 2015-08-24 NOTE — Telephone Encounter (Signed)
Patient cancelled appointment for tomorrow, 08/25/15 at 9:30 AM and voiced that she will reschedule for a later date.

## 2015-08-25 ENCOUNTER — Encounter: Payer: Self-pay | Admitting: Medical

## 2015-08-29 ENCOUNTER — Encounter (HOSPITAL_BASED_OUTPATIENT_CLINIC_OR_DEPARTMENT_OTHER): Payer: Self-pay | Admitting: Emergency Medicine

## 2015-08-29 ENCOUNTER — Emergency Department (HOSPITAL_BASED_OUTPATIENT_CLINIC_OR_DEPARTMENT_OTHER)
Admission: EM | Admit: 2015-08-29 | Discharge: 2015-08-29 | Disposition: A | Discharge status: Home / Self Care | Payer: Medicaid Other | Attending: Emergency Medicine | Admitting: Emergency Medicine

## 2015-08-29 DIAGNOSIS — R002 Palpitations: Secondary | ICD-10-CM

## 2015-08-29 DIAGNOSIS — Z3202 Encounter for pregnancy test, result negative: Secondary | ICD-10-CM | POA: Insufficient documentation

## 2015-08-29 DIAGNOSIS — Z79899 Other long term (current) drug therapy: Secondary | ICD-10-CM | POA: Insufficient documentation

## 2015-08-29 DIAGNOSIS — I10 Essential (primary) hypertension: Secondary | ICD-10-CM | POA: Insufficient documentation

## 2015-08-29 LAB — CBC WITH DIFFERENTIAL/PLATELET
BASOS ABS: 0.1 10*3/uL (ref 0.0–0.1)
Basophils Relative: 1 %
Eosinophils Absolute: 0.1 10*3/uL (ref 0.0–0.7)
Eosinophils Relative: 1 %
HCT: 36.4 % (ref 36.0–46.0)
HEMOGLOBIN: 12 g/dL (ref 12.0–15.0)
LYMPHS PCT: 34 %
Lymphs Abs: 2.7 10*3/uL (ref 0.7–4.0)
MCH: 26.5 pg (ref 26.0–34.0)
MCHC: 33 g/dL (ref 30.0–36.0)
MCV: 80.4 fL (ref 78.0–100.0)
MONO ABS: 0.7 10*3/uL (ref 0.1–1.0)
MONOS PCT: 8 %
NEUTROS ABS: 4.4 10*3/uL (ref 1.7–7.7)
NEUTROS PCT: 56 %
PLATELETS: 269 10*3/uL (ref 150–400)
RBC: 4.53 MIL/uL (ref 3.87–5.11)
RDW: 14.4 % (ref 11.5–15.5)
WBC: 7.9 10*3/uL (ref 4.0–10.5)

## 2015-08-29 LAB — BASIC METABOLIC PANEL
ANION GAP: 6 (ref 5–15)
BUN: 9 mg/dL (ref 6–20)
CHLORIDE: 110 mmol/L (ref 101–111)
CO2: 21 mmol/L — ABNORMAL LOW (ref 22–32)
Calcium: 8.9 mg/dL (ref 8.9–10.3)
Creatinine, Ser: 0.77 mg/dL (ref 0.44–1.00)
Glucose, Bld: 105 mg/dL — ABNORMAL HIGH (ref 65–99)
POTASSIUM: 3.9 mmol/L (ref 3.5–5.1)
SODIUM: 137 mmol/L (ref 135–145)

## 2015-08-29 LAB — TROPONIN I

## 2015-08-29 LAB — PREGNANCY, URINE: PREG TEST UR: NEGATIVE

## 2015-08-29 NOTE — Discharge Instructions (Signed)
Do not hesitate to return to the emergency room for any new, worsening or concerning symptoms.  Please obtain primary care using resource guide below. Let them know that you were seen in the emergency room and that they will need to obtain records for further outpatient management.   Palpitations A palpitation is the feeling that your heartbeat is irregular or is faster than normal. It may feel like your heart is fluttering or skipping a beat. Palpitations are usually not a serious problem. However, in some cases, you may need further medical evaluation. CAUSES  Palpitations can be caused by:  Smoking.  Caffeine or other stimulants, such as diet pills or energy drinks.  Alcohol.  Stress and anxiety.  Strenuous physical activity.  Fatigue.  Certain medicines.  Heart disease, especially if you have a history of irregular heart rhythms (arrhythmias), such as atrial fibrillation, atrial flutter, or supraventricular tachycardia.  An improperly working pacemaker or defibrillator. DIAGNOSIS  To find the cause of your palpitations, your health care provider will take your medical history and perform a physical exam. Your health care provider may also have you take a test called an ambulatory electrocardiogram (ECG). An ECG records your heartbeat patterns over a 24-hour period. You may also have other tests, such as:  Transthoracic echocardiogram (TTE). During echocardiography, sound waves are used to evaluate how blood flows through your heart.  Transesophageal echocardiogram (TEE).  Cardiac monitoring. This allows your health care provider to monitor your heart rate and rhythm in real time.  Holter monitor. This is a portable device that records your heartbeat and can help diagnose heart arrhythmias. It allows your health care provider to track your heart activity for several days, if needed.  Stress tests by exercise or by giving medicine that makes the heart beat faster. TREATMENT    Treatment of palpitations depends on the cause of your symptoms and can vary greatly. Most cases of palpitations do not require any treatment other than time, relaxation, and monitoring your symptoms. Other causes, such as atrial fibrillation, atrial flutter, or supraventricular tachycardia, usually require further treatment. HOME CARE INSTRUCTIONS   Avoid:  Caffeinated coffee, tea, soft drinks, diet pills, and energy drinks.  Chocolate.  Alcohol.  Stop smoking if you smoke.  Reduce your stress and anxiety. Things that can help you relax include:  A method of controlling things in your body, such as your heartbeats, with your mind (biofeedback).  Yoga.  Meditation.  Physical activity such as swimming, jogging, or walking.  Get plenty of rest and sleep. SEEK MEDICAL CARE IF:   You continue to have a fast or irregular heartbeat beyond 24 hours.  Your palpitations occur more often. SEEK IMMEDIATE MEDICAL CARE IF:  You have chest pain or shortness of breath.  You have a severe headache.  You feel dizzy or you faint. MAKE SURE YOU:  Understand these instructions.  Will watch your condition.  Will get help right away if you are not doing well or get worse.   This information is not intended to replace advice given to you by your health care provider. Make sure you discuss any questions you have with your health care provider.   Document Released: 08/10/2000 Document Revised: 08/18/2013 Document Reviewed: 10/12/2011 Elsevier Interactive Patient Education 2016 [employer].   Emergency Department Resource Guide 1) Find a Doctor and Pay Out of Pocket Although you won't have to find out who is covered by your insurance plan, it is a good idea to ask around and  get recommendations. You will then need to call the office and see if the doctor you have chosen will accept you as a new patient and what types of options they offer for patients who are self-pay. Some doctors  offer discounts or will set up payment plans for their patients who do not have insurance, but you will need to ask so you aren't surprised when you get to your appointment.  2) Contact Your Local Health Department Not all health departments have doctors that can see patients for sick visits, but many do, so it is worth a call to see if yours does. If you don't know where your local health department is, you can check in your phone book. The CDC also has a tool to help you locate your state's health department, and many state websites also have listings of all of their local health departments.  3) Find a [facility] If your illness is not likely to be very severe or complicated, you may want to try a walk in clinic. These are popping up all over the country in pharmacies, drugstores, and shopping centers. They're usually staffed by nurse practitioners or physician assistants that have been trained to treat common illnesses and complaints. They're usually fairly quick and inexpensive. However, if you have serious medical issues or chronic medical problems, these are probably not your best option.  No Primary Care Doctor: - Call Health Connect at  [phone] - they can help you locate a primary care doctor that  accepts your insurance, provides certain services, etc. - Physician Referral Service- [phone]  Chronic Pain Problems: Organization         Address  Phone   Notes  [facility]  [phone] Patients need to be referred by their primary care doctor.   Medication Assistance: Organization         Address  Phone   Notes  [facility] Medication [facility] [address] [place], [place] [address] [phone] --Must be a resident of [facility] -- Must have NO insurance coverage whatsoever (no Medicaid/ Medicare, etc.) -- The pt. MUST have a primary care doctor that directs their care regularly and follows them in the community    MedAssist  [phone]   [employer]  [phone]    Agencies that provide inexpensive medical care: Organization         Address  Phone   Notes  [place]  [phone]   [facility]    [phone]   [facility] [place], [place] [address] [phone]   [place] [address], [place] [phone]   Planned Parenthood    [phone]   [facility]    [phone]   [place] and [address], [place] Phone:  [phone], Fax:  [phone] Hours of Operation:  9 am - 6 pm, M-F.  Also accepts Medicaid/Medicare and self-pay.  [facility] for [place], [address], [place] Phone: [phone], Fax: [phone]. Hours of Operation:  8:30 am - 5:30 pm, M-F.  Also accepts Medicaid and self-pay.  [facility] [address], [place] Phone: [phone]   [place], [place], [place] [phone], Ext. [phone] Mondays & Thursdays: 7-9 AM.  First 15 patients are seen on a  first come, first serve basis.    [place] Providers:  Organization         Address  Phone   Notes  [facility] [address], [place] [phone] Also accepts self-pay patients.  [facility] [id number] [address], [place]  [phone]   [place], [address], [place] [phone]   [facility] [address], [place] [phone]   Lucianne Lei [address], [place]   [phone] Only accepts [place] Access [place] patients after they have their name applied to their card.   Self-Pay (no insurance) in [facility]:  Organization         Address  Phone   Notes  Sickle Cell Patients, [facility] Internal  Medicine [place] [phone]   [facility] [place] [phone]   [facility] [place]  [place], [place], [place] [phone]   [facility]/Dr. Osei-Bonsu  [address], [place] or [address], [place] [phone] Phone number for both [place] and [place] locations is the same.  Urgent Medical and [facility] [address] [place] [phone]   [facility] [address], [place] or [address] [phone]   [facility] [address] [place] [phone], phone; [phone], fax Sees patients 1st and 3rd Saturday of every month.  Must not qualify for public or private insurance (i.e. Medicaid, Medicare, [place] Health Choice, Veterans' Benefits)  Household income should be no more than 200% of the poverty level The clinic cannot treat you if you are pregnant or think you are pregnant  Sexually transmitted diseases are not treated at the clinic.    Dental Care: Organization         Address  Phone  Notes  [facility] Department of [facility] [place] [phone] Accepts children up to age 66 who are enrolled in [place] or [place]; pregnant women with a Medicaid card; and children who have applied for Medicaid or [place] Health Choice, but were declined, whose parents can pay a reduced fee at time of service.  [facility] Department of [facility]  [address], [place] [phone] Accepts children up to age 44 who are enrolled in [place] or [place]; pregnant women with a Medicaid card; and children who have applied for Medicaid or [place] Health Choice, but were declined, whose parents can pay a reduced fee at time of service.  [place] Adult Dental Access PROGRAM  [place] [phone] Patients are seen by appointment only. Walk-ins are not accepted. [place] will see patients 30 years of age and older. Monday - Tuesday (8am-5pm) Most Wednesdays (8:30-5pm) $30 per visit, cash only  [facility] Adult Dental Access PROGRAM  [address], [facility] [phone] Patients are seen by appointment only. Walk-ins are not accepted. [place] will see patients 54 years of age and older. One Wednesday Evening (Monthly: Volunteer Based).  $30 per visit, cash only  [place]  [phone] for adults; Children under age 60, call Graduate Pediatric Dentistry at [phone]. Children aged  4-14, please call [phone] to request a pediatric application.  Dental services are provided in all areas of dental care including fillings, crowns and bridges, complete and partial dentures, implants, gum treatment, root canals, and extractions. Preventive care is also provided. Treatment is provided to both adults and children. Patients are selected via a lottery and there is often a waiting list.   [facility] [address] [place]  [phone] [email or web address]   [facility] [address] [place], [place] [phone], Ext. [phone] Second and Fourth Thursday of each month, opens at 6:30 AM; Clinic ends at 9 AM.  Patients are seen on a first-come first-served basis, and a limited number are seen during each clinic.   [facility]  [address] Hillard Danker [place], [place] [phone]   Eligibility Requirements You must have lived in [place], [place], or [place] counties for at least the last three months.   You cannot be eligible for state or federal sponsored [employer], including [employer], [place], or [employer].   You generally cannot be eligible for healthcare insurance through your employer.    How to apply: Eligibility screenings are held every Tuesday and  Wednesday afternoon from 1:00 pm until 4:00 pm. You do not need an appointment for the interview!  [facility] [address], [place], [place]   [place]  [place] Department  [place]  [phone]    Behavioral Health Resources in the Community: Intensive Outpatient Programs Organization         Address  Phone  Notes  [place]. [address], [place], [place] [phone]   [facility] Outpatient [address] [place], [place]   ADS: Alcohol & Drug Svcs [address] [place], [place]   [place] [address]  [place], [place] or [phone]   Substance Abuse Resources Organization         Address  Phone  Notes  Alcohol and Drug Services  [phone]   [place]  [phone]   [facility] [place]   Chinita Pester  [phone]   Residential & Outpatient Substance Abuse Program  [phone]   Psychological Services Organization         Address  Phone  Notes  [facility] [place]  [place]  [phone]   [place] [address], [place] or [phone]    Mobile Crisis Teams Organization         Address  Phone  Notes  [facility], Mobile Crisis Care Unit  [phone]   [facility]  [address] [place], [place]   [place] [address] [place] [phone]    Self-Help/Support Groups Organization         Address  Phone             Notes  [place]. of [place] - variety of support groups  [place] Call for more information  Narcotics Anonymous (NA), [facility] [address], [place]  2 meetings at this location   Financial planner         Address  Phone  Notes  [facility] [place],    [place]  [phone]  [facility]  [address], [place] [id number], [place], [place]   [place], [place] [phone] Admissions: 8am-3pm M-F  Incentives Substance [place] [address]    [place], [place] [phone]   [facility] [address] [place], [place], [place]   [facility] [address]  [place], [place]   [facility] Programs - Intensive Outpatient [address], [place], [place]   [facility] ([place].) [address]  [place], [place] [phone] or [phone]   [facility] Services (RTS) [address] [place], [place] Accepts Medicaid  [facility] [place] [address]  [place] [phone] Substance Abuse/Addiction Treatment   [facility] Organization         Address  Phone  Notes  [facility]  [phone]   Domenic Schwab, PhD [address] Arlis Porta [place], [place]   [phone] or [phone]   [place], [place] [phone]   [facility] [address] [place], [place] [phone] Insurance/Medicaid/sponsorship through [facility] and Families [address] [place]                                    [place], [place] [phone] [place] [address], [place] [phone]    Dr. Adele Schilder  [phone]   [facility] Dept. 1) [address] [place] 2) [place] 3)  [place] [address], [place] [phone]  [phone]   [place] [phone] or [phone] (After Hours)

## 2015-08-29 NOTE — ED Notes (Signed)
Onset x 1 week of palpatations  And high B/P

## 2015-08-29 NOTE — ED Provider Notes (Signed)
CSN: [id number]     Arrival date & time 08/29/15  1000 History   First MD Initiated Contact with Patient 08/29/15 1024     Chief Complaint  Patient presents with  . Palpitations     (Consider location/radiation/quality/duration/timing/severity/associated sxs/prior Treatment) HPI   Blood pressure 128/87, pulse 76, temperature 98.4 F (36.9 C), temperature source Oral, resp. rate 16, height 5\' 6"  (1.676 m), weight 72.576 kg, last menstrual period 09/21/2014, SpO2 99 %, unknown if currently breastfeeding.  Mariah Livingston is a 35 y.o. female complaining of high blood pressure, states she's been out of her blood pressure medication for 2 weeks. States that her blood pressure is normal today because she's been eating garlic over the last several days. Associated palpitations with no syncope, chest pain, shortness of breath, history of DVT or PE, recent travel or mobilizations, calf pain or leg swelling, cough, fever, chills, history of tobacco use, history of diabetes, high cholesterol, family history of early cardiac death, cocaine, methamphetamine use   Past Medical History  Diagnosis Date  . Hypertension   . Miscarriage    Past Surgical History  Procedure Laterality Date  . Cosmetic surgery      abdominal liposuction   Family History  Problem Relation Age of Onset  . Hypertension Mother    Social History  Substance Use Topics  . Smoking status: Never Smoker   . Smokeless tobacco: Never Used  . Alcohol Use: 0.6 oz/week    1 Cans of beer per week     Comment: 1-2 cans daily of beer, more on weekend   OB History    Gravida Para Term Preterm AB TAB SAB Ectopic Multiple Living   4 0 0 0 4 3 1 0 0 0      Review of Systems  10 systems reviewed and found to be negative, except as noted in the HPI.   Allergies  Review of patient's allergies indicates no known allergies.  Home Medications   Prior to Admission medications   Medication Sig Start Date End Date Taking? Authorizing  Provider  HYDRALAZINE-HCTZ PO Take 1 tablet by mouth daily.     Historical Provider, MD  OVER THE COUNTER MEDICATION OTC allergy medications as needed.    Historical Provider, MD  Prenatal Vit-Fe Fumarate-FA (PRENATAL COMPLETE) 14-0.4 MG TABS Take 1 tablet by mouth daily. 10/25/14   Amaura Authier, PA-C   BP 128/87 mmHg  Pulse 76  Temp(Src) 98.4 F (36.9 C) (Oral)  Resp 16  Ht 5\' 6"  (1.676 m)  Wt 72.576 kg  BMI 25.84 kg/m2  SpO2 99%  LMP 09/21/2014 (Exact Date) Physical Exam  Constitutional: She is oriented to person, place, and time. She appears well-developed and well-nourished. No distress.  HENT:  Head: Normocephalic.  Mouth/Throat: Oropharynx is clear and moist.  Eyes: Conjunctivae are normal. Pupils are equal, round, and reactive to light.  Neck: Normal range of motion. No JVD present. No tracheal deviation present.  Cardiovascular: Normal rate, regular rhythm and intact distal pulses.   Radial pulse equal bilaterally  Pulmonary/Chest: Effort normal and breath sounds normal. No stridor. No respiratory distress. She has no wheezes. She has no rales. She exhibits no tenderness.  Abdominal: Soft. She exhibits no distension and no mass. There is no tenderness. There is no rebound and no guarding.  Musculoskeletal: Normal range of motion. She exhibits no edema or tenderness.  No calf asymmetry, superficial collaterals, palpable cords, edema, Homans sign negative bilaterally.    Neurological: She is alert and  oriented to person, place, and time.  Skin: Skin is warm. She is not diaphoretic.  Psychiatric: She has a normal mood and affect.  Nursing note and vitals reviewed.   ED Course  Procedures (including critical care time) Labs Review Labs Reviewed  BASIC METABOLIC PANEL - Abnormal; Notable for the following:    CO2 21 (*)    Glucose, Bld 105 (*)    All other components within normal limits  CBC WITH DIFFERENTIAL/PLATELET  TROPONIN I  PREGNANCY, URINE    Imaging  Review No results found. I have personally reviewed and evaluated these images and lab results as part of my medical decision-making.   EKG Interpretation   Date/Time:  Monday August 29 2015 11:03:43 EST Ventricular Rate:  78 PR Interval:  166 QRS Duration: 72 QT Interval:  392 QTC Calculation: 446 R Axis:   58 Text Interpretation:  Normal sinus rhythm Normal ECG No old tracing to  compare Confirmed by BELFI  MD, MELANIE ([id number]) on 08/29/2015 11:11:42 AM      MDM   Final diagnoses:  Heart palpitations    Filed Vitals:   08/29/15 1006  BP: 128/87  Pulse: 76  Temp: 98.4 F (36.9 C)  TempSrc: Oral  Resp: 16  Height: 5\' 6"  (1.676 m)  Weight: 72.576 kg  SpO2: 99%    Mariah Livingston is 35 y.o. female presenting with complaint of elevated blood pressure and palpitations. EKG normal sinus rhythm, no tachycardia or bradycardia. Blood work with no abnormalities. On further discussion with this patient and in extensive chart review I see that this patient has not been written any antihypertensive medications since 2 years ago, initially she states that she's been taking her medications daily and ran out 2 weeks ago, when further pressed she states that she is taking it every few weeks. Chart review shows this patient has not had any significant elevation in her blood pressure in the last 2 years and this is essentially without treatment. I've advised her that I will not refill her blood pressure medication she is given a resource guide to establish primary care.  Evaluation does not show pathology that would require ongoing emergent intervention or inpatient treatment. Pt is hemodynamically stable and mentating appropriately. Discussed findings and plan with patient/guardian, who agrees with care plan. All questions answered. Return precautions discussed and outpatient follow up given.   New Prescriptions   No medications on file         Monico Blitz, PA-C 08/29/15  [place], MD 08/29/15 1451

## 2015-11-08 ENCOUNTER — Telehealth: Payer: Self-pay | Admitting: Medical

## 2015-11-08 NOTE — Telephone Encounter (Signed)
LM for pt to call and schedule flu shot or update records. °

## 2018-03-26 ENCOUNTER — Emergency Department
Admission: EM | Admit: 2018-03-26 | Discharge: 2018-03-26 | Disposition: A | Discharge status: Home / Self Care | Payer: Self-pay | Attending: Emergency Medicine | Admitting: Emergency Medicine

## 2018-03-26 ENCOUNTER — Other Ambulatory Visit: Payer: Self-pay

## 2018-03-26 DIAGNOSIS — R1010 Upper abdominal pain, unspecified: Secondary | ICD-10-CM | POA: Insufficient documentation

## 2018-03-26 DIAGNOSIS — K2900 Acute gastritis without bleeding: Secondary | ICD-10-CM | POA: Insufficient documentation

## 2018-03-26 DIAGNOSIS — I10 Essential (primary) hypertension: Secondary | ICD-10-CM | POA: Insufficient documentation

## 2018-03-26 DIAGNOSIS — Z79899 Other long term (current) drug therapy: Secondary | ICD-10-CM | POA: Insufficient documentation

## 2018-03-26 LAB — URINALYSIS, COMPLETE (UACMP) WITH MICROSCOPIC
BACTERIA UA: NONE SEEN
Bilirubin Urine: NEGATIVE
Glucose, UA: NEGATIVE mg/dL
Hgb urine dipstick: NEGATIVE
Ketones, ur: 5 mg/dL — AB
Leukocytes, UA: NEGATIVE
Nitrite: NEGATIVE
PROTEIN: 30 mg/dL — AB
SPECIFIC GRAVITY, URINE: 1.029 (ref 1.005–1.030)
pH: 5 (ref 5.0–8.0)

## 2018-03-26 LAB — CBC
HEMATOCRIT: 38.1 % (ref 35.0–47.0)
HEMOGLOBIN: 12.9 g/dL (ref 12.0–16.0)
MCH: 27.8 pg (ref 26.0–34.0)
MCHC: 33.8 g/dL (ref 32.0–36.0)
MCV: 82.1 fL (ref 80.0–100.0)
Platelets: 229 10*3/uL (ref 150–440)
RBC: 4.64 MIL/uL (ref 3.80–5.20)
RDW: 14.8 % — AB (ref 11.5–14.5)
WBC: 8.2 10*3/uL (ref 3.6–11.0)

## 2018-03-26 LAB — COMPREHENSIVE METABOLIC PANEL
ALBUMIN: 4.2 g/dL (ref 3.5–5.0)
ALT: 19 U/L (ref 0–44)
ANION GAP: 7 (ref 5–15)
AST: 20 U/L (ref 15–41)
Alkaline Phosphatase: 68 U/L (ref 38–126)
BUN: 10 mg/dL (ref 6–20)
CO2: 25 mmol/L (ref 22–32)
Calcium: 9.2 mg/dL (ref 8.9–10.3)
Chloride: 105 mmol/L (ref 98–111)
Creatinine, Ser: 0.76 mg/dL (ref 0.44–1.00)
GFR calc Af Amer: >60 mL/min (ref >60)
GFR calc non Af Amer: >60 mL/min (ref >60)
GLUCOSE: 125 mg/dL — AB (ref 70–99)
Potassium: 3.5 mmol/L (ref 3.5–5.1)
SODIUM: 137 mmol/L (ref 135–145)
Total Bilirubin: 0.5 mg/dL (ref 0.3–1.2)
Total Protein: 7.9 g/dL (ref 6.5–8.1)

## 2018-03-26 LAB — POCT PREGNANCY, URINE: PREG TEST UR: NEGATIVE

## 2018-03-26 LAB — LIPASE, BLOOD: Lipase: 33 U/L (ref 11–51)

## 2018-03-26 MED ORDER — RANITIDINE HCL 150 MG PO CAPS: 150.0000 mg | ORAL_CAPSULE | Freq: Two times a day (BID) | ORAL | 0 refills | Status: DC

## 2018-03-26 NOTE — ED Triage Notes (Signed)
Pt c/o generalized abd pain with N/V since Sunday.. Denies diarrhea, in NAD at present.

## 2018-03-26 NOTE — ED Provider Notes (Signed)
[facility] Emergency Department Provider Note  ____________________________________________  Time seen: Approximately 12:48 PM  I have reviewed the triage vital signs and the nursing notes.   HISTORY  Chief Complaint Abdominal Pain    HPI Mariah Livingston is a 37 y.o. female with a history of hypertension and miscarriage who comes to the ED complaining of upper abdominal pain and nausea and vomiting for the past 4 days.  The abdominal pain is upper, nonradiating described as cramping.  Lasted for only that first day 4 days ago and has not reoccurred.  No aggravating or alleviating factors.  Mild to moderate intensity and intermittent when present.  Nausea and vomiting are persistent every day, worse with eating.  Worse at night when lying down.  No alleviating factors.  Has not tried any medicines.  Today she was able to eat this morning without any further nausea or vomiting.  She does feel like she is getting better.  Symptoms all started after eating a lot of spicy food 4 days ago.  She has a history of intolerance of spicy food.   No sick contacts   Past Medical History:  Diagnosis Date  . Hypertension   . Miscarriage      Patient Active Problem List   Diagnosis Date Noted  . Essential hypertension 08/16/2014     Past Surgical History:  Procedure Laterality Date  . COSMETIC SURGERY     abdominal liposuction     Prior to Admission medications   Medication Sig Start Date End Date Taking? Authorizing Provider  HYDRALAZINE-HCTZ PO Take 1 tablet by mouth daily.     [provider]  OVER THE COUNTER MEDICATION OTC allergy medications as needed.    [provider]  Prenatal Vit-Fe Fumarate-FA (PRENATAL COMPLETE) 14-0.4 MG TABS Take 1 tablet by mouth daily. 10/25/14   Pisciotta, Elmyra Ricks, PA-C  ranitidine (ZANTAC) 150 MG capsule Take 1 capsule (150 mg total) by mouth 2 (two) times daily. 03/26/18   Carrie Mew, MD   hydrochlorothiazide (HYDRODIURIL) 25 MG tablet Take 1 tablet (25 mg total) by mouth daily. 08/16/14 10/25/14  Saguier, Percell Miller, PA-C     Allergies Patient has no known allergies.   Family History  Problem Relation Age of Onset  . Hypertension Mother     Social History Social History   Tobacco Use  . Smoking status: Never Smoker  . Smokeless tobacco: Never Used  Substance Use Topics  . Alcohol use: Yes    Alcohol/week: 0.6 oz    Types: 1 Cans of beer per week    Comment: 1-2 cans daily of beer, more on weekend  . Drug use: No    Review of Systems  Constitutional:   No fever or chills.  ENT:   No sore throat. No rhinorrhea. Cardiovascular:   No chest pain or syncope. Respiratory:   No dyspnea or cough. Gastrointestinal:   Positive as above for abdominal pain and vomiting.  No constipation or diarrhea. Musculoskeletal:   Negative for focal pain or swelling All other systems reviewed and are negative except as documented above in ROS and HPI.  ____________________________________________   PHYSICAL EXAM:  VITAL SIGNS: ED Triage Vitals  Enc Vitals Group     BP 03/26/18 1026 131/85     Pulse Rate 03/26/18 1026 79     Resp 03/26/18 1026 17     Temp 03/26/18 1026 98.4 F (36.9 C)     Temp Source 03/26/18 1026 Oral     SpO2  03/26/18 1026 100 %     Weight 03/26/18 1027 170 lb (77.1 kg)     Height 03/26/18 1027 5\' 6"  (1.676 m)     Head Circumference --      Peak Flow --      Pain Score 03/26/18 1027 9     Pain Loc --      Pain Edu? --      Excl. in [place]? --     Vital signs reviewed, nursing assessments reviewed.   Constitutional:   Alert and oriented. Non-toxic appearance. Eyes:   Conjunctivae are normal. EOMI. PERRL. ENT      Head:   Normocephalic and atraumatic.      Nose:   No congestion/rhinnorhea.       Mouth/Throat:   MMM, mild pharyngeal erythema. No peritonsillar mass.       Neck:   No meningismus. Full ROM.  Thyroid  nonpalpable Hematological/Lymphatic/Immunilogical:   No cervical lymphadenopathy. Cardiovascular:   RRR. Symmetric bilateral radial and DP pulses.  No murmurs. Cap refill less than 2 seconds. Respiratory:   Normal respiratory effort without tachypnea/retractions. Breath sounds are clear and equal bilaterally. No wheezes/rales/rhonchi. Gastrointestinal:   Soft and nontender. Non distended. There is no CVA tenderness.  No rebound, rigidity, or guarding. Musculoskeletal:   Normal range of motion in all extremities. No joint effusions.  No lower extremity tenderness.  No edema. Neurologic:   Normal speech and language.  Motor grossly intact. No acute focal neurologic deficits are appreciated.  Skin:    Skin is warm, dry and intact. No rash noted.  No petechiae, purpura, or bullae.  ____________________________________________    LABS (pertinent positives/negatives) (all labs ordered are listed, but only abnormal results are displayed) Labs Reviewed  COMPREHENSIVE METABOLIC PANEL - Abnormal; Notable for the following components:      Result Value   Glucose, Bld 125 (*)    All other components within normal limits  CBC - Abnormal; Notable for the following components:   RDW 14.8 (*)    All other components within normal limits  URINALYSIS, COMPLETE (UACMP) WITH MICROSCOPIC - Abnormal; Notable for the following components:   Color, Urine YELLOW (*)    APPearance CLEAR (*)    Ketones, ur 5 (*)    Protein, ur 30 (*)    All other components within normal limits  LIPASE, BLOOD  POC URINE PREG, ED  POCT PREGNANCY, URINE   ____________________________________________   EKG    ____________________________________________    RADIOLOGY  No results found.  ____________________________________________   PROCEDURES Procedures  ____________________________________________    CLINICAL IMPRESSION / ASSESSMENT AND PLAN / ED COURSE  Pertinent labs & imaging results that were  available during my care of the patient were reviewed by me and considered in my medical decision making (see chart for details).    Patient presents with abdominal pain and vomiting, most consistent with GERD and gastritis.  Improving.  Vital signs are normal.  Labs are unremarkable.  Exam is benign and reassuring.Considering the patient's symptoms, medical history, and physical examination today, I have low suspicion for cholecystitis or biliary pathology, pancreatitis, perforation or bowel obstruction, hernia, intra-abdominal abscess, AAA or dissection, volvulus or intussusception, mesenteric ischemia, or appendicitis.  Recommend 1 week course of H2 blocker, follow-up with primary care.  Avoiding acidic foods and spicy foods in the meantime.      ____________________________________________   FINAL CLINICAL IMPRESSION(S) / ED DIAGNOSES    Final diagnoses:  Pain of upper abdomen  Acute gastritis without hemorrhage, unspecified gastritis type     ED Discharge Orders        Ordered    ranitidine (ZANTAC) 150 MG capsule  2 times daily     03/26/18 1248      Portions of this note were generated with dragon dictation software. Dictation errors may occur despite best attempts at proofreading.    Carrie Mew, MD 03/26/18 1250

## 2018-04-16 ENCOUNTER — Emergency Department (HOSPITAL_BASED_OUTPATIENT_CLINIC_OR_DEPARTMENT_OTHER)
Admission: EM | Admit: 2018-04-16 | Discharge: 2018-04-16 | Disposition: A | Discharge status: Home / Self Care | Payer: Self-pay | Attending: Emergency Medicine | Admitting: Emergency Medicine

## 2018-04-16 ENCOUNTER — Encounter (HOSPITAL_BASED_OUTPATIENT_CLINIC_OR_DEPARTMENT_OTHER): Payer: Self-pay | Admitting: Emergency Medicine

## 2018-04-16 DIAGNOSIS — M7918 Myalgia, other site: Secondary | ICD-10-CM | POA: Insufficient documentation

## 2018-04-16 MED ORDER — IBUPROFEN 400 MG PO TABS
400.0000 mg | ORAL_TABLET | Freq: Once | ORAL | Status: AC
  Administered 2018-04-16: 400 mg via ORAL
  Filled 2018-04-16: qty 1

## 2018-04-16 NOTE — ED Notes (Signed)
Pt understood dc material. NAD noted. 
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
## 2018-04-16 NOTE — ED Notes (Signed)
ED Provider at bedside. 
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
## 2018-08-25 ENCOUNTER — Emergency Department (HOSPITAL_BASED_OUTPATIENT_CLINIC_OR_DEPARTMENT_OTHER)
Admission: EM | Admit: 2018-08-25 | Discharge: 2018-08-25 | Disposition: A | Discharge status: Home / Self Care | Payer: Self-pay | Attending: Emergency Medicine | Admitting: Emergency Medicine

## 2018-08-25 ENCOUNTER — Encounter (HOSPITAL_BASED_OUTPATIENT_CLINIC_OR_DEPARTMENT_OTHER): Payer: Self-pay | Admitting: Emergency Medicine

## 2018-08-25 ENCOUNTER — Other Ambulatory Visit: Payer: Self-pay

## 2018-08-25 ENCOUNTER — Emergency Department (HOSPITAL_BASED_OUTPATIENT_CLINIC_OR_DEPARTMENT_OTHER): Payer: Self-pay

## 2018-08-25 DIAGNOSIS — O09521 Supervision of elderly multigravida, first trimester: Secondary | ICD-10-CM | POA: Insufficient documentation

## 2018-08-25 DIAGNOSIS — O26891 Other specified pregnancy related conditions, first trimester: Secondary | ICD-10-CM | POA: Insufficient documentation

## 2018-08-25 DIAGNOSIS — Z3492 Encounter for supervision of normal pregnancy, unspecified, second trimester: Secondary | ICD-10-CM

## 2018-08-25 DIAGNOSIS — Z3201 Encounter for pregnancy test, result positive: Secondary | ICD-10-CM | POA: Insufficient documentation

## 2018-08-25 DIAGNOSIS — O10011 Pre-existing essential hypertension complicating pregnancy, first trimester: Secondary | ICD-10-CM | POA: Insufficient documentation

## 2018-08-25 DIAGNOSIS — Z79899 Other long term (current) drug therapy: Secondary | ICD-10-CM | POA: Insufficient documentation

## 2018-08-25 DIAGNOSIS — Z3A00 Weeks of gestation of pregnancy not specified: Secondary | ICD-10-CM | POA: Insufficient documentation

## 2018-08-25 LAB — URINALYSIS, ROUTINE W REFLEX MICROSCOPIC
Bilirubin Urine: NEGATIVE
Glucose, UA: NEGATIVE mg/dL
Hgb urine dipstick: NEGATIVE
Ketones, ur: NEGATIVE mg/dL
Leukocytes, UA: NEGATIVE
Nitrite: NEGATIVE
Protein, ur: NEGATIVE mg/dL
Specific Gravity, Urine: 1.015 (ref 1.005–1.030)
pH: 6.5 (ref 5.0–8.0)

## 2018-08-25 LAB — HCG, SERUM, QUALITATIVE: Preg, Serum: POSITIVE — AB

## 2018-08-25 LAB — HCG, QUANTITATIVE, PREGNANCY: hCG, Beta Chain, Quant, S: 36385 m[IU]/mL — ABNORMAL HIGH (ref <5)

## 2018-08-25 NOTE — ED Triage Notes (Addendum)
Patient reports fever x 3 weeks.  States she has not been treating this at home.  Additionally reports lower abdominal pain.  Denies nausea, vomiting.   When asked about LMP patient stated September 4.  Asked patient if this was normal for her, she replied "no".  Patient with flat affect.  Asked patient if she was pregnant.  Patient replies "yes".  States she has not seen an obgyn and does not know her EDD.  Denies vaginal bleeding, discharge.

## 2018-08-25 NOTE — ED Notes (Addendum)
Pt stated that she is in no pain. She stated that she was here b/c she has a fever, which is 98.1 and to get a ultasound

## 2018-08-25 NOTE — ED Provider Notes (Signed)
[place] [facility] EMERGENCY DEPARTMENT Provider Note   CSN: [id number] Arrival date & time: 08/25/18  1026     History   Chief Complaint Chief Complaint  Patient presents with  . Fever  . Abdominal Pain    HPI Deva Ron is a 37 y.o. female is here for evaluation of missed menses.  Her last menstrual period was September 4.  At that time she took a pregnancy test and it was positive.  She is here for an ultrasound to confirm her pregnancy but states she does not want an intravaginal ultrasound.  She has had 2 pregnancies and 2 miscarriages in the past.  She had a brief, lower abdominal cramping 3 days ago but this resolved.  She denies any dysuria, hematuria, vaginal discharge, vaginal bleeding.  She is taking prenatal vitamins.  She called [facility] to make an appointment with OB but could not be seen for another 3 weeks.  No known medical problems or daily medicines.  She does not smoke cigarettes or use tobacco.  No EtOH or other illicit drug use. No modifying factors. No interventions.   HPI  Past Medical History:  Diagnosis Date  . Hypertension   . Miscarriage     Patient Active Problem List   Diagnosis Date Noted  . Essential hypertension 08/16/2014    Past Surgical History:  Procedure Laterality Date  . COSMETIC SURGERY     abdominal liposuction     OB History    Gravida  5   Para  0   Term  0   Preterm  0   AB  4   Living  0     SAB  1   TAB  3   Ectopic  0   Multiple  0   Live Births               Home Medications    Prior to Admission medications   Medication Sig Start Date End Date Taking? Authorizing Provider  HYDRALAZINE-HCTZ PO Take 1 tablet by mouth daily.     [provider]  OVER THE COUNTER MEDICATION OTC allergy medications as needed.    [provider]  Prenatal Vit-Fe Fumarate-FA (PRENATAL COMPLETE) 14-0.4 MG TABS Take 1 tablet by mouth daily. 10/25/14   Pisciotta, Elmyra Ricks, PA-C  ranitidine  (ZANTAC) 150 MG capsule Take 1 capsule (150 mg total) by mouth 2 (two) times daily. 03/26/18   Carrie Mew, MD    Family History Family History  Problem Relation Age of Onset  . Hypertension Mother     Social History Social History   Tobacco Use  . Smoking status: Never Smoker  . Smokeless tobacco: Never Used  Substance Use Topics  . Alcohol use: Yes    Alcohol/week: 1.0 standard drinks    Types: 1 Cans of beer per week    Comment: 1-2 cans daily of beer, more on weekend  . Drug use: No     Allergies   Tylenol [acetaminophen]   Review of Systems Review of Systems  All other systems reviewed and are negative.    Physical Exam Updated Vital Signs BP (!) 135/91 (BP Location: Left Arm)   Pulse 97   Temp 98.1 F (36.7 C) (Oral)   Resp 20   Ht 5\' 6"  (1.676 m)   Wt 74.4 kg   LMP 04/30/2018 (Approximate)   SpO2 100%   BMI 26.47 kg/m   Physical Exam Vitals signs and nursing note reviewed.  Constitutional:  Appearance: She is well-developed.     Comments: Non toxic  HENT:     Head: Normocephalic and atraumatic.     Nose: Nose normal.  Eyes:     Conjunctiva/sclera: Conjunctivae normal.     Pupils: Pupils are equal, round, and reactive to light.  Neck:     Musculoskeletal: Normal range of motion.  Cardiovascular:     Rate and Rhythm: Normal rate and regular rhythm.  Pulmonary:     Effort: Pulmonary effort is normal.     Breath sounds: Normal breath sounds.  Abdominal:     General: Bowel sounds are normal.     Palpations: Abdomen is soft.     Tenderness: There is no abdominal tenderness.     Comments: Gravid non tender abdomen.  No G/R/R. No suprapubic or CVA tenderness. Negative Murphy's and McBurney's.  Musculoskeletal: Normal range of motion.  Skin:    General: Skin is warm and dry.     Capillary Refill: Capillary refill takes less than 2 seconds.  Neurological:     Mental Status: She is alert and oriented to person, place, and time.    Psychiatric:        Behavior: Behavior normal.      ED Treatments / Results  Labs (all labs ordered are listed, but only abnormal results are displayed) Labs Reviewed  HCG, QUANTITATIVE, PREGNANCY - Abnormal; Notable for the following components:      Result Value   hCG, Beta Chain, Quant, S 36,385 (*)    All other components within normal limits  HCG, SERUM, QUALITATIVE - Abnormal; Notable for the following components:   Preg, Serum POSITIVE (*)    All other components within normal limits  URINALYSIS, ROUTINE W REFLEX MICROSCOPIC    EKG None  Radiology US Ob Limited  Result Date: 08/25/2018 CLINICAL DATA:  Question pregnancy status. Positive pregnancy test. History of 2 miscarriages. EXAM: LIMITED OBSTETRIC ULTRASOUND AND TRANSVAGINAL OBSTETRIC ULTRASOUND FINDINGS: Number of Fetuses: 1 Heart Rate:  135 bpm Movement: Present Presentation: Cephalic Placental Location: Posterior fundal Previa: No Amniotic Fluid (Subjective): Normal BPD:  3.8cm 17w 4d MATERNAL FINDINGS: Cervix:  Appears closed. Uterus/Adnexae:  No abnormality visualized. IMPRESSION: Single viable injury pregnancy at 17 weeks 4 days. This exam is performed on an emergent basis and does not comprehensively evaluate fetal size, dating, or anatomy; follow-up complete OB US should be considered if further fetal assessment is warranted. Electronically Signed   By: Marcello Moores  Register   On: 08/25/2018 12:24    Procedures Procedures (including critical care time)  Medications Ordered in ED Medications - No data to display   Initial Impression / Assessment and Plan / ED Course  I have reviewed the triage vital signs and the nursing notes.  Pertinent labs & imaging results that were available during my care of the patient were reviewed by me and considered in my medical decision making (see chart for details).    37 yo with h/o miscarriages here for ultrasound to confirm pregnancy. No concerning features such as  abdominal/pelvic pain, vaginal bleeding or discharge, UTI symptoms.  Exam reassuring.  Hcg quant and limited ob US obtained. She declined intravaginal [place]. [place] today confirms uncomplicated pregnancy. Results given to patient. I do not see indication for further emergent lab/imaging today.  She is already taking prenatal vitamins. Provided education on 2nd trimester pregnancy to patient. She was encouraged to f/u with [facility] as scheduled in 3 weeks. Return precautions given. Pt verbalized understanding and comfortable with  this.   Final Clinical Impressions(s) / ED Diagnoses   Final diagnoses:  Second trimester pregnancy    ED Discharge Orders    None       Kinnie Feil, PA-C 08/25/18 1245    Long, Wonda Olds, MD 08/25/18 1944

## 2018-08-25 NOTE — Discharge Instructions (Addendum)
You were seen in the ER for missed period and concern for pregnancy.   Ultrasound confirms single live fetus.    You need to follow up with [facility] for further formal ultrasound and routine prenatal care.   Return for abdominal or pelvic pain, vaginal bleeding, burning with urination, blood in your urine.   Do not consume any alcohol, tobacco products. Do not consume any ibuprofen or aspirin products.

## 2018-12-01 DIAGNOSIS — Z3480 Encounter for supervision of other normal pregnancy, unspecified trimester: Secondary | ICD-10-CM | POA: Insufficient documentation

## 2018-12-02 ENCOUNTER — Encounter: Payer: Self-pay | Admitting: Obstetrics & Gynecology

## 2018-12-02 ENCOUNTER — Other Ambulatory Visit: Payer: Self-pay

## 2018-12-02 ENCOUNTER — Ambulatory Visit (INDEPENDENT_AMBULATORY_CARE_PROVIDER_SITE_OTHER): Payer: Medicaid Other | Admitting: Obstetrics & Gynecology

## 2018-12-02 DIAGNOSIS — O10913 Unspecified pre-existing hypertension complicating pregnancy, third trimester: Secondary | ICD-10-CM

## 2018-12-02 DIAGNOSIS — Z3481 Encounter for supervision of other normal pregnancy, first trimester: Secondary | ICD-10-CM

## 2018-12-02 DIAGNOSIS — O1093 Unspecified pre-existing hypertension complicating the puerperium: Secondary | ICD-10-CM | POA: Insufficient documentation

## 2018-12-02 DIAGNOSIS — Z3A30 30 weeks gestation of pregnancy: Secondary | ICD-10-CM

## 2018-12-02 DIAGNOSIS — O10919 Unspecified pre-existing hypertension complicating pregnancy, unspecified trimester: Secondary | ICD-10-CM

## 2018-12-02 DIAGNOSIS — O115 Pre-existing hypertension with pre-eclampsia, complicating the puerperium: Secondary | ICD-10-CM | POA: Insufficient documentation

## 2018-12-02 DIAGNOSIS — Z3480 Encounter for supervision of other normal pregnancy, unspecified trimester: Secondary | ICD-10-CM

## 2018-12-02 MED ORDER — ASPIRIN EC 81 MG PO TBEC: 81.0000 mg | DELAYED_RELEASE_TABLET | Freq: Every day | ORAL | 1 refills | Status: AC

## 2018-12-02 NOTE — Progress Notes (Signed)
  Subjective:    Mariah Livingston is a [id number] [redacted]w[redacted]d being seen today for her first obstetrical visit.  Her obstetrical history is significant for advanced maternal age and Hypertension, late prenatal care. Patient does intend to breast feed. Pregnancy history fully reviewed.  Patient reports no complaints.  Vitals:   12/02/18 1552 12/02/18 1554  BP: (!) 150/81 129/84  Pulse: 90   Weight: 179 lb (81.2 kg)     HISTORY: OB History  Gravida Para Term Preterm AB Living  5 0 0 0 4 0  SAB TAB Ectopic Multiple Live Births  1 3 0 0      # Outcome Date GA Lbr Len/2nd Weight Sex Delivery Anes PTL Lv  5 Current           4 SAB 09/2014          3 TAB 2008          2 TAB 2005          1 TAB 2001           Past Medical History:  Diagnosis Date  . Hypertension   . Miscarriage    Past Surgical History:  Procedure Laterality Date  . COSMETIC SURGERY     abdominal liposuction   Family History  Problem Relation Age of Onset  . Hypertension Mother      Exam    Uterus:  Fundal Height: 32 cm  Pelvic Exam:   Declines breast and pelvic exam                                 Skin: normal coloration and turgor, no rashes    Neurologic: oriented, normal mood   Extremities: normal strength, tone, and muscle mass   HEENT PERRLA and thyroid without masses   Mouth/Teeth mucous membranes moist, pharynx normal without lesions and dental hygiene good   Neck supple and no masses   Cardiovascular: regular rate and rhythm   Respiratory:  appears well, vitals normal, no respiratory distress, acyanotic, normal RR   Abdomen: gravid   Urinary:        Assessment:    Pregnancy: [id number] Patient Active Problem List   Diagnosis Date Noted  . Chronic hypertension during pregnancy, antepartum 12/02/2018  . Supervision of other normal pregnancy, antepartum 12/01/2018  . Essential hypertension 08/16/2014        Plan:     Initial labs drawn. Prenatal vitamins. Problem list reviewed and  updated. Genetic Screening discussednot done due to late GA.  Ultrasound discussed; fetal survey: ordered.  Follow up in 2 weeks. %0% of 30 min visit spent on counseling and coordination of care.  Transfer to [place] 12/02/2018

## 2018-12-02 NOTE — Patient Instructions (Signed)
Hypertension During Pregnancy ° °Hypertension is also called high blood pressure. High blood pressure means that the force of your blood moving in your body is too strong. When you are pregnant, this condition should be watched carefully. It can cause problems for you and your baby. °Follow these instructions at home: °Eating and drinking ° °· Drink enough fluid to keep your pee (urine) pale yellow. °· Avoid caffeine. °Lifestyle °· Do not use any products that contain nicotine or tobacco, such as cigarettes and e-cigarettes. If you need help quitting, ask your doctor. °· Do not use alcohol or drugs. °· Avoid stress. °· Rest and get plenty of sleep. °General instructions °· Take over-the-counter and prescription medicines only as told by your doctor. °· While lying down, lie on your left side. This keeps pressure off your major blood vessels. °· While sitting or lying down, raise (elevate) your feet. Try putting some pillows under your lower legs. °· Exercise regularly. Ask your doctor what kinds of exercise are best for you. °· Keep all prenatal and follow-up visits as told by your doctor. This is important. °Contact a doctor if: °· You have symptoms that your doctor told you to watch for, such as: °? Throwing up (vomiting). °? Feeling sick to your stomach (nausea). °? Headache. °Get help right away if you have: °· Very bad belly pain that does not get better with treatment. °· A very bad headache that does not get better. °· Throwing up that does not get better with treatment. °· Sudden, fast weight gain. °· Sudden swelling in your hands, ankles, or face. °· Bleeding from your vagina. °· Blood in your pee. °· Fewer movements from your baby than usual. °· Blurry vision. °· Double vision. °· Muscle twitching. °· Sudden muscle tightening (spasms). °· Trouble breathing. °· Blue fingernails or lips. °Summary °· Hypertension is also called high blood pressure. High blood pressure means that the force of your blood moving  in your body is too strong. °· When you are pregnant, this condition should be watched carefully. It can cause problems for you and your baby. °· Get help right away if you have symptoms that your doctor told you to watch for. °This information is not intended to replace advice given to you by your health care provider. Make sure you discuss any questions you have with your health care provider. °Document Released: 09/15/2010 Document Revised: 07/30/2017 Document Reviewed: 04/24/2016 °Elsevier Interactive Patient Education © 2019 Elsevier Inc. ° °

## 2018-12-03 LAB — OBSTETRIC PANEL, INCLUDING HIV
Antibody Screen: NEGATIVE
Basophils Absolute: 0 10*3/uL (ref 0.0–0.2)
Basos: 1 %
EOS (ABSOLUTE): 0.1 10*3/uL (ref 0.0–0.4)
Eos: 1 %
HIV Screen 4th Generation wRfx: NONREACTIVE
Hematocrit: 32 % — ABNORMAL LOW (ref 34.0–46.6)
Hemoglobin: 10.6 g/dL — ABNORMAL LOW (ref 11.1–15.9)
Hepatitis B Surface Ag: NEGATIVE
Immature Grans (Abs): 0 10*3/uL (ref 0.0–0.1)
Immature Granulocytes: 0 %
Lymphocytes Absolute: 2.4 10*3/uL (ref 0.7–3.1)
Lymphs: 31 %
MCH: 26.3 pg — ABNORMAL LOW (ref 26.6–33.0)
MCHC: 33.1 g/dL (ref 31.5–35.7)
MCV: 79 fL (ref 79–97)
Monocytes Absolute: 0.8 10*3/uL (ref 0.1–0.9)
Monocytes: 10 %
Neutrophils Absolute: 4.5 10*3/uL (ref 1.4–7.0)
Neutrophils: 57 %
Platelets: 187 10*3/uL (ref 150–450)
RBC: 4.03 x10E6/uL (ref 3.77–5.28)
RDW: 14.1 % (ref 11.7–15.4)
RPR Ser Ql: NONREACTIVE
Rh Factor: POSITIVE
Rubella Antibodies, IGG: >33 index (ref >0.99)
WBC: 7.8 10*3/uL (ref 3.4–10.8)

## 2018-12-03 LAB — COMPREHENSIVE METABOLIC PANEL
ALT: 9 IU/L (ref 0–32)
AST: 8 IU/L (ref 0–40)
Albumin/Globulin Ratio: 1.2 (ref 1.2–2.2)
Albumin: 3.7 g/dL — ABNORMAL LOW (ref 3.8–4.8)
Alkaline Phosphatase: 89 IU/L (ref 39–117)
BUN/Creatinine Ratio: 13 (ref 9–23)
BUN: 7 mg/dL (ref 6–20)
Bilirubin Total: <0.2 mg/dL (ref 0.0–1.2)
CO2: 18 mmol/L — ABNORMAL LOW (ref 20–29)
Calcium: 8.7 mg/dL (ref 8.7–10.2)
Chloride: 110 mmol/L — ABNORMAL HIGH (ref 96–106)
Creatinine, Ser: 0.53 mg/dL — ABNORMAL LOW (ref 0.57–1.00)
GFR calc Af Amer: 139 mL/min/{1.73_m2} (ref >59)
GFR calc non Af Amer: 121 mL/min/{1.73_m2} (ref >59)
Globulin, Total: 3 g/dL (ref 1.5–4.5)
Glucose: 101 mg/dL — ABNORMAL HIGH (ref 65–99)
Potassium: 4 mmol/L (ref 3.5–5.2)
Sodium: 141 mmol/L (ref 134–144)
Total Protein: 6.7 g/dL (ref 6.0–8.5)

## 2018-12-03 LAB — PROTEIN / CREATININE RATIO, URINE
Creatinine, Urine: 181.5 mg/dL
Protein, Ur: 24.4 mg/dL
Protein/Creat Ratio: 134 mg/g creat (ref 0–200)

## 2018-12-06 LAB — URINE CULTURE, OB REFLEX

## 2018-12-06 LAB — CULTURE, OB URINE

## 2018-12-08 ENCOUNTER — Other Ambulatory Visit: Payer: Self-pay | Admitting: Obstetrics & Gynecology

## 2018-12-08 DIAGNOSIS — O9989 Other specified diseases and conditions complicating pregnancy, childbirth and the puerperium: Principal | ICD-10-CM

## 2018-12-08 DIAGNOSIS — R8271 Bacteriuria: Secondary | ICD-10-CM

## 2018-12-08 MED ORDER — NITROFURANTOIN MONOHYD MACRO 100 MG PO CAPS: 100.0000 mg | ORAL_CAPSULE | Freq: Two times a day (BID) | ORAL | 1 refills | Status: DC

## 2018-12-08 NOTE — Progress Notes (Signed)
Macrobid for bacteriuria

## 2018-12-09 ENCOUNTER — Telehealth: Payer: Self-pay

## 2018-12-09 NOTE — Telephone Encounter (Signed)
-----   Message from Woodroe Mode, MD sent at 12/08/2018  4:59 PM EDT ----- Rx sent for bacteriuria

## 2018-12-09 NOTE — Telephone Encounter (Signed)
  Left VM message to pick up RX.

## 2018-12-11 ENCOUNTER — Ambulatory Visit (HOSPITAL_COMMUNITY)
Admission: RE | Admit: 2018-12-11 | Discharge: 2018-12-11 | Disposition: A | Discharge status: Home / Self Care | Payer: Medicaid Other | Source: Ambulatory Visit | Attending: Obstetrics & Gynecology | Admitting: Obstetrics & Gynecology

## 2018-12-11 ENCOUNTER — Encounter (HOSPITAL_COMMUNITY): Payer: Self-pay

## 2018-12-11 ENCOUNTER — Other Ambulatory Visit: Payer: Self-pay

## 2018-12-11 ENCOUNTER — Ambulatory Visit (HOSPITAL_COMMUNITY): Payer: Medicaid Other | Admitting: *Deleted

## 2018-12-11 VITALS — BP 122/74 | HR 83 | Temp 98.2°F

## 2018-12-11 DIAGNOSIS — Z363 Encounter for antenatal screening for malformations: Secondary | ICD-10-CM

## 2018-12-11 DIAGNOSIS — O350XX0 Maternal care for (suspected) central nervous system malformation in fetus, not applicable or unspecified: Secondary | ICD-10-CM

## 2018-12-11 DIAGNOSIS — O3413 Maternal care for benign tumor of corpus uteri, third trimester: Secondary | ICD-10-CM

## 2018-12-11 DIAGNOSIS — Z3480 Encounter for supervision of other normal pregnancy, unspecified trimester: Secondary | ICD-10-CM

## 2018-12-11 DIAGNOSIS — D259 Leiomyoma of uterus, unspecified: Secondary | ICD-10-CM

## 2018-12-11 DIAGNOSIS — O10919 Unspecified pre-existing hypertension complicating pregnancy, unspecified trimester: Secondary | ICD-10-CM

## 2018-12-11 DIAGNOSIS — O09523 Supervision of elderly multigravida, third trimester: Secondary | ICD-10-CM | POA: Diagnosis present

## 2018-12-11 DIAGNOSIS — O0933 Supervision of pregnancy with insufficient antenatal care, third trimester: Secondary | ICD-10-CM

## 2018-12-11 DIAGNOSIS — O10013 Pre-existing essential hypertension complicating pregnancy, third trimester: Secondary | ICD-10-CM | POA: Diagnosis not present

## 2018-12-11 DIAGNOSIS — Z3A32 32 weeks gestation of pregnancy: Secondary | ICD-10-CM

## 2018-12-12 ENCOUNTER — Other Ambulatory Visit (HOSPITAL_COMMUNITY): Payer: Self-pay | Admitting: *Deleted

## 2018-12-12 DIAGNOSIS — O10919 Unspecified pre-existing hypertension complicating pregnancy, unspecified trimester: Secondary | ICD-10-CM

## 2018-12-16 ENCOUNTER — Ambulatory Visit (INDEPENDENT_AMBULATORY_CARE_PROVIDER_SITE_OTHER): Payer: Medicaid Other | Admitting: Obstetrics and Gynecology

## 2018-12-16 ENCOUNTER — Encounter: Payer: Self-pay | Admitting: Obstetrics and Gynecology

## 2018-12-16 ENCOUNTER — Other Ambulatory Visit: Payer: Medicaid Other

## 2018-12-16 ENCOUNTER — Other Ambulatory Visit: Payer: Self-pay

## 2018-12-16 VITALS — BP 131/81 | HR 96 | Wt 180.6 lb

## 2018-12-16 DIAGNOSIS — O3413 Maternal care for benign tumor of corpus uteri, third trimester: Secondary | ICD-10-CM

## 2018-12-16 DIAGNOSIS — O10919 Unspecified pre-existing hypertension complicating pregnancy, unspecified trimester: Secondary | ICD-10-CM

## 2018-12-16 DIAGNOSIS — Z3480 Encounter for supervision of other normal pregnancy, unspecified trimester: Secondary | ICD-10-CM

## 2018-12-16 DIAGNOSIS — O10913 Unspecified pre-existing hypertension complicating pregnancy, third trimester: Secondary | ICD-10-CM

## 2018-12-16 DIAGNOSIS — D259 Leiomyoma of uterus, unspecified: Secondary | ICD-10-CM

## 2018-12-16 DIAGNOSIS — O3410 Maternal care for benign tumor of corpus uteri, unspecified trimester: Secondary | ICD-10-CM

## 2018-12-16 DIAGNOSIS — Z3A32 32 weeks gestation of pregnancy: Secondary | ICD-10-CM

## 2018-12-16 NOTE — Patient Instructions (Signed)
Third Trimester of Pregnancy The third trimester is from week 28 through week 40 (months 7 through 9). The third trimester is a time when the unborn baby (fetus) is growing rapidly. At the end of the ninth month, the fetus is about 20 inches in length and weighs 6-10 pounds. Body changes during your third trimester Your body will continue to go through many changes during pregnancy. The changes vary from woman to woman. During the third trimester:  Your weight will continue to increase. You can expect to gain 25-35 pounds (11-16 kg) by the end of the pregnancy.  You may begin to get stretch marks on your hips, abdomen, and breasts.  You may urinate more often because the fetus is moving lower into your pelvis and pressing on your bladder.  You may develop or continue to have heartburn. This is caused by increased hormones that slow down muscles in the digestive tract.  You may develop or continue to have constipation because increased hormones slow digestion and cause the muscles that push waste through your intestines to relax.  You may develop hemorrhoids. These are swollen veins (varicose veins) in the rectum that can itch or be painful.  You may develop swollen, bulging veins (varicose veins) in your legs.  You may have increased body aches in the pelvis, back, or thighs. This is due to weight gain and increased hormones that are relaxing your joints.  You may have changes in your hair. These can include thickening of your hair, rapid growth, and changes in texture. Some women also have hair loss during or after pregnancy, or hair that feels dry or thin. Your hair will most likely return to normal after your baby is born.  Your breasts will continue to grow and they will continue to become tender. A yellow fluid (colostrum) may leak from your breasts. This is the first milk you are producing for your baby.  Your belly button may stick out.  You may notice more swelling in your hands,  face, or ankles.  You may have increased tingling or numbness in your hands, arms, and legs. The skin on your belly may also feel numb.  You may feel short of breath because of your expanding uterus.  You may have more problems sleeping. This can be caused by the size of your belly, increased need to urinate, and an increase in your body's metabolism.  You may notice the fetus "dropping," or moving lower in your abdomen (lightening).  You may have increased vaginal discharge.  You may notice your joints feel loose and you may have pain around your pelvic bone. What to expect at prenatal visits You will have prenatal exams every 2 weeks until week 36. Then you will have weekly prenatal exams. During a routine prenatal visit:  You will be weighed to make sure you and the baby are growing normally.  Your blood pressure will be taken.  Your abdomen will be measured to track your baby's growth.  The fetal heartbeat will be listened to.  Any test results from the previous visit will be discussed.  You may have a cervical check near your due date to see if your cervix has softened or thinned (effaced).  You will be tested for Group B streptococcus. This happens between 35 and 37 weeks. Your health care provider may ask you:  What your birth plan is.  How you are feeling.  If you are feeling the baby move.  If you have had any abnormal   symptoms, such as leaking fluid, bleeding, severe headaches, or abdominal cramping.  If you are using any tobacco products, including cigarettes, chewing tobacco, and electronic cigarettes.  If you have any questions. Other tests or screenings that may be performed during your third trimester include:  Blood tests that check for low iron levels (anemia).  Fetal testing to check the health, activity level, and growth of the fetus. Testing is done if you have certain medical conditions or if there are problems during the pregnancy.  Nonstress test  (NST). This test checks the health of your baby to make sure there are no signs of problems, such as the baby not getting enough oxygen. During this test, a belt is placed around your belly. The baby is made to move, and its heart rate is monitored during movement. What is false labor? False labor is a condition in which you feel small, irregular tightenings of the muscles in the womb (contractions) that usually go away with rest, changing position, or drinking water. These are called Braxton Hicks contractions. Contractions may last for hours, days, or even weeks before true labor sets in. If contractions come at regular intervals, become more frequent, increase in intensity, or become painful, you should see your health care provider. What are the signs of labor?  Abdominal cramps.  Regular contractions that start at 10 minutes apart and become stronger and more frequent with time.  Contractions that start on the top of the uterus and spread down to the lower abdomen and back.  Increased pelvic pressure and dull back pain.  A watery or bloody mucus discharge that comes from the vagina.  Leaking of amniotic fluid. This is also known as your "water breaking." It could be a slow trickle or a gush. Let your health care provider know if it has a color or strange odor. If you have any of these signs, call your health care provider right away, even if it is before your due date. Follow these instructions at home: Medicines  Follow your health care provider's instructions regarding medicine use. Specific medicines may be either safe or unsafe to take during pregnancy.  Take a prenatal vitamin that contains at least 600 micrograms (mcg) of folic acid.  If you develop constipation, try taking a stool softener if your health care provider approves. Eating and drinking   Eat a balanced diet that includes fresh fruits and vegetables, whole grains, good sources of protein such as meat, eggs, or tofu,  and low-fat dairy. Your health care provider will help you determine the amount of weight gain that is right for you.  Avoid raw meat and uncooked cheese. These carry germs that can cause birth defects in the baby.  If you have low calcium intake from food, talk to your health care provider about whether you should take a daily calcium supplement.  Eat four or five small meals rather than three large meals a day.  Limit foods that are high in fat and processed sugars, such as fried and sweet foods.  To prevent constipation: ? Drink enough fluid to keep your urine clear or pale yellow. ? Eat foods that are high in fiber, such as fresh fruits and vegetables, whole grains, and beans. Activity  Exercise only as directed by your health care provider. Most women can continue their usual exercise routine during pregnancy. Try to exercise for 30 minutes at least 5 days a week. Stop exercising if you experience uterine contractions.  Avoid heavy lifting.  Do   not exercise in extreme heat or humidity, or at high altitudes.  Wear low-heel, comfortable shoes.  Practice good posture.  You may continue to have sex unless your health care provider tells you otherwise. Relieving pain and discomfort  Take frequent breaks and rest with your legs elevated if you have leg cramps or low back pain.  Take warm sitz baths to soothe any pain or discomfort caused by hemorrhoids. Use hemorrhoid cream if your health care provider approves.  Wear a good support bra to prevent discomfort from breast tenderness.  If you develop varicose veins: ? Wear support pantyhose or compression stockings as told by your healthcare provider. ? Elevate your feet for 15 minutes, 3-4 times a day. Prenatal care  Write down your questions. Take them to your prenatal visits.  Keep all your prenatal visits as told by your health care provider. This is important. Safety  Wear your seat belt at all times when driving.  Make  a list of emergency phone numbers, including numbers for family, friends, the hospital, and police and fire departments. General instructions  Avoid cat litter boxes and soil used by cats. These carry germs that can cause birth defects in the baby. If you have a cat, ask someone to clean the litter box for you.  Do not travel far distances unless it is absolutely necessary and only with the approval of your health care provider.  Do not use hot tubs, steam rooms, or saunas.  Do not drink alcohol.  Do not use any products that contain nicotine or tobacco, such as cigarettes and e-cigarettes. If you need help quitting, ask your health care provider.  Do not use any medicinal herbs or unprescribed drugs. These chemicals affect the formation and growth of the baby.  Do not douche or use tampons or scented sanitary pads.  Do not cross your legs for long periods of time.  To prepare for the arrival of your baby: ? Take prenatal classes to understand, practice, and ask questions about labor and delivery. ? Make a trial run to the hospital. ? Visit the hospital and tour the maternity area. ? Arrange for maternity or paternity leave through employers. ? Arrange for family and friends to take care of pets while you are in the hospital. ? Purchase a rear-facing car seat and make sure you know how to install it in your car. ? Pack your hospital bag. ? Prepare the baby's nursery. Make sure to remove all pillows and stuffed animals from the baby's crib to prevent suffocation.  Visit your dentist if you have not gone during your pregnancy. Use a soft toothbrush to brush your teeth and be gentle when you floss. Contact a health care provider if:  You are unsure if you are in labor or if your water has broken.  You become dizzy.  You have mild pelvic cramps, pelvic pressure, or nagging pain in your abdominal area.  You have lower back pain.  You have persistent nausea, vomiting, or  diarrhea.  You have an unusual or bad smelling vaginal discharge.  You have pain when you urinate. Get help right away if:  Your water breaks before 37 weeks.  You have regular contractions less than 5 minutes apart before 37 weeks.  You have a fever.  You are leaking fluid from your vagina.  You have spotting or bleeding from your vagina.  You have severe abdominal pain or cramping.  You have rapid weight loss or weight gain.  You have   shortness of breath with chest pain.  You notice sudden or extreme swelling of your face, hands, ankles, feet, or legs.  Your baby makes fewer than 10 movements in 2 hours.  You have severe headaches that do not go away when you take medicine.  You have vision changes. Summary  The third trimester is from week 28 through week 40, months 7 through 9. The third trimester is a time when the unborn baby (fetus) is growing rapidly.  During the third trimester, your discomfort may increase as you and your baby continue to gain weight. You may have abdominal, leg, and back pain, sleeping problems, and an increased need to urinate.  During the third trimester your breasts will keep growing and they will continue to become tender. A yellow fluid (colostrum) may leak from your breasts. This is the first milk you are producing for your baby.  False labor is a condition in which you feel small, irregular tightenings of the muscles in the womb (contractions) that eventually go away. These are called Braxton Hicks contractions. Contractions may last for hours, days, or even weeks before true labor sets in.  Signs of labor can include: abdominal cramps; regular contractions that start at 10 minutes apart and become stronger and more frequent with time; watery or bloody mucus discharge that comes from the vagina; increased pelvic pressure and dull back pain; and leaking of amniotic fluid. This information is not intended to replace advice given to you by your  health care provider. Make sure you discuss any questions you have with your health care provider. Document Released: 08/07/2001 Document Revised: 09/18/2016 Document Reviewed: 09/18/2016 Elsevier Interactive Patient Education  2019 Elsevier Inc.  

## 2018-12-16 NOTE — Progress Notes (Signed)
Patient reports fetal movement, denies pain. Pt states that she does have BP cuff at home.

## 2018-12-16 NOTE — Progress Notes (Signed)
Subjective:  Mariah Livingston is a 38 y.o. G5P0040 at [redacted]w[redacted]d being seen today for ongoing prenatal care.  She is currently monitored for the following issues for this high-risk pregnancy and has Essential hypertension; Supervision of other normal pregnancy, antepartum; Chronic hypertension during pregnancy, antepartum; and Uterine fibroids affecting pregnancy on their problem list.  Patient reports no complaints.  Contractions: Not present. Vag. Bleeding: None.  Movement: Present. Denies leaking of fluid.   The following portions of the patient's history were reviewed and updated as appropriate: allergies, current medications, past family history, past medical history, past social history, past surgical history and problem list. Problem list updated.  Objective:   Vitals:   12/16/18 0950  BP: 131/81  Pulse: 96  Weight: 180 lb 9.6 oz (81.9 kg)    Fetal Status: Fetal Heart Rate (bpm): 135   Movement: Present     General:  Alert, oriented and cooperative. Patient is in no acute distress.  Skin: Skin is warm and dry. No rash noted.   Cardiovascular: Normal heart rate noted  Respiratory: Normal respiratory effort, no problems with respiration noted  Abdomen: Soft, gravid, appropriate for gestational age. Pain/Pressure: Absent     Pelvic:  Cervical exam deferred        Extremities: Normal range of motion.  Edema: None  Mental Status: Normal mood and affect. Normal behavior. Normal judgment and thought content.   Urinalysis:      Assessment and Plan:  Pregnancy: [id number] at [redacted]w[redacted]d  1. Supervision of other normal pregnancy, antepartum Stable Has BP cuff but no email. Pt instructed to monitor and record BP 2-3 times a week - Glucose Tolerance, 2 Hours w/1 Hour - CBC - RPR - HIV Antibody (routine testing w rflx)  2. Chronic hypertension during pregnancy, antepartum BP stable Not taking BASA Discussed importance of taking See above regarding BP cuff and monitoring U/S 12/11/18, follow up  ordered  3. 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 Please refer to After Visit Summary for other counseling recommendations.  Return in about 4 weeks (around 01/13/2019) for OB visit, televisit.   Chancy Milroy, MD

## 2018-12-17 LAB — CBC
Hematocrit: 30.2 % — ABNORMAL LOW (ref 34.0–46.6)
Hemoglobin: 9.8 g/dL — ABNORMAL LOW (ref 11.1–15.9)
MCH: 25.8 pg — ABNORMAL LOW (ref 26.6–33.0)
MCHC: 32.5 g/dL (ref 31.5–35.7)
MCV: 80 fL (ref 79–97)
Platelets: 170 10*3/uL (ref 150–450)
RBC: 3.8 x10E6/uL (ref 3.77–5.28)
RDW: 14.5 % (ref 11.7–15.4)
WBC: 7.8 10*3/uL (ref 3.4–10.8)

## 2018-12-17 LAB — GLUCOSE TOLERANCE, 2 HOURS W/ 1HR
Glucose, 1 hour: 187 mg/dL — ABNORMAL HIGH (ref 65–179)
Glucose, 2 hour: 149 mg/dL (ref 65–152)
Glucose, Fasting: 81 mg/dL (ref 65–91)

## 2018-12-17 LAB — HIV ANTIBODY (ROUTINE TESTING W REFLEX): HIV Screen 4th Generation wRfx: NONREACTIVE

## 2018-12-17 LAB — RPR: RPR Ser Ql: NONREACTIVE

## 2018-12-22 ENCOUNTER — Telehealth: Payer: Self-pay

## 2018-12-22 ENCOUNTER — Other Ambulatory Visit: Payer: Self-pay

## 2018-12-22 DIAGNOSIS — Z3480 Encounter for supervision of other normal pregnancy, unspecified trimester: Secondary | ICD-10-CM

## 2018-12-22 MED ORDER — ACCU-CHEK GUIDE ME W/DEVICE KIT: 1.0000 | PACK | Freq: Four times a day (QID) | 0 refills | Status: DC

## 2018-12-22 MED ORDER — ACCU-CHEK FASTCLIX LANCETS MISC: 1.0000 | Freq: Four times a day (QID) | 12 refills | Status: DC

## 2018-12-22 MED ORDER — GLUCOSE BLOOD VI STRP: ORAL_STRIP | 12 refills | Status: DC

## 2018-12-22 NOTE — Telephone Encounter (Signed)
-----   Message from Chancy Milroy, MD sent at 12/18/2018 11:44 AM EDT ----- Please let pt know that she failed her Glucola and has gestational DM Refer to diabetic educator Send in Rx for Glucometer, lacents and strips if able Rx for iron supplement qd d/t anemia as well Thanks Legrand Como

## 2018-12-22 NOTE — Telephone Encounter (Signed)
  Patient notified of GTT and Iron results, MFM Referral and Diabetic Supplies Rx sent to her preferred Pharmacy.  She was told to get Iron Pills OTC as [facility] Insurance will not pay for for Rx.   She verbalized understanding and agreement.

## 2018-12-31 ENCOUNTER — Encounter: Payer: Self-pay | Admitting: Registered"

## 2018-12-31 ENCOUNTER — Ambulatory Visit: Payer: Medicaid Other | Admitting: Registered"

## 2018-12-31 DIAGNOSIS — O99810 Abnormal glucose complicating pregnancy: Secondary | ICD-10-CM

## 2018-12-31 NOTE — Patient Instructions (Addendum)
Look over packet sent to you in the mail to review the education presented over the phone.   Consider switching the whole milk out for 1% or 2% milk to reduce saturated fat in your diet.  Consider eating a balanced dinner which may help lower you morning fasting blood sugar. This may also reduce hunger in the middle of the night.

## 2018-12-31 NOTE — Progress Notes (Signed)
This visit was completed via telephone due to the COVID-19 pandemic.   I spoke with Mariah Livingston and verified that I was speaking with the correct person with two patient identifiers (full name and date of birth).   I discussed the limitations related to this kind of visit and the patient is willing to proceed.  Patient was seen on 12/31/2018 for Gestational Diabetes self-management education via telephone visit through the [facility]. The following learning objectives were met by the patient during this course:   States the definition of Gestational Diabetes  States why dietary management is important in controlling blood glucose  Describes the effects each nutrient has on blood glucose levels  Demonstrates ability to create a balanced meal plan  Demonstrates carbohydrate counting   States when to check blood glucose levels  Demonstrates proper blood glucose monitoring techniques  States the effect of stress and exercise on blood glucose levels  States the importance of limiting caffeine and abstaining from alcohol and smoking  Blood glucose monitor given: none Patient states she has been SMBG ~1 week. FBG range 89-127 mg/dL; PPBG 98-127 mg/dL - discussed management of morning highs. She said she was not eating dinner, only drinking milk in the evenings. She also wakes up in middle of night because she is hungry and just drinks more milk. I asked her to try eating a balanced meal in the evening to see if it helps BG.  Patient instructed to monitor glucose levels: FBS: 60 - <95; 1 hour: <140; 2 hour: <120  Patient received handouts:  Nutrition Diabetes and Pregnancy, including carb counting list  Patient will be seen for follow-up as needed.

## 2019-01-08 ENCOUNTER — Other Ambulatory Visit: Payer: Self-pay

## 2019-01-08 ENCOUNTER — Other Ambulatory Visit (HOSPITAL_COMMUNITY): Payer: Self-pay | Admitting: *Deleted

## 2019-01-08 ENCOUNTER — Ambulatory Visit (HOSPITAL_COMMUNITY)
Admission: RE | Admit: 2019-01-08 | Discharge: 2019-01-08 | Disposition: A | Discharge status: Home / Self Care | Payer: Medicaid Other | Source: Ambulatory Visit | Attending: Obstetrics and Gynecology | Admitting: Obstetrics and Gynecology

## 2019-01-08 ENCOUNTER — Ambulatory Visit (HOSPITAL_COMMUNITY): Payer: Medicaid Other | Admitting: *Deleted

## 2019-01-08 ENCOUNTER — Encounter (HOSPITAL_COMMUNITY): Payer: Self-pay

## 2019-01-08 VITALS — BP 122/71 | HR 98 | Temp 98.6°F

## 2019-01-08 DIAGNOSIS — D259 Leiomyoma of uterus, unspecified: Secondary | ICD-10-CM

## 2019-01-08 DIAGNOSIS — O09523 Supervision of elderly multigravida, third trimester: Secondary | ICD-10-CM | POA: Insufficient documentation

## 2019-01-08 DIAGNOSIS — Z362 Encounter for other antenatal screening follow-up: Secondary | ICD-10-CM

## 2019-01-08 DIAGNOSIS — O24410 Gestational diabetes mellitus in pregnancy, diet controlled: Secondary | ICD-10-CM

## 2019-01-08 DIAGNOSIS — O10919 Unspecified pre-existing hypertension complicating pregnancy, unspecified trimester: Secondary | ICD-10-CM | POA: Insufficient documentation

## 2019-01-08 DIAGNOSIS — O350XX0 Maternal care for (suspected) central nervous system malformation in fetus, not applicable or unspecified: Secondary | ICD-10-CM

## 2019-01-08 DIAGNOSIS — O3413 Maternal care for benign tumor of corpus uteri, third trimester: Secondary | ICD-10-CM | POA: Diagnosis not present

## 2019-01-08 DIAGNOSIS — Z3A36 36 weeks gestation of pregnancy: Secondary | ICD-10-CM

## 2019-01-08 DIAGNOSIS — O0933 Supervision of pregnancy with insufficient antenatal care, third trimester: Secondary | ICD-10-CM

## 2019-01-08 DIAGNOSIS — O10013 Pre-existing essential hypertension complicating pregnancy, third trimester: Secondary | ICD-10-CM | POA: Diagnosis not present

## 2019-01-08 DIAGNOSIS — O10913 Unspecified pre-existing hypertension complicating pregnancy, third trimester: Secondary | ICD-10-CM

## 2019-01-13 ENCOUNTER — Other Ambulatory Visit: Payer: Self-pay

## 2019-01-13 ENCOUNTER — Ambulatory Visit (INDEPENDENT_AMBULATORY_CARE_PROVIDER_SITE_OTHER): Payer: Medicaid Other | Admitting: Obstetrics and Gynecology

## 2019-01-13 ENCOUNTER — Encounter: Payer: Self-pay | Admitting: Obstetrics and Gynecology

## 2019-01-13 VITALS — BP 127/79 | HR 98

## 2019-01-13 DIAGNOSIS — O10919 Unspecified pre-existing hypertension complicating pregnancy, unspecified trimester: Secondary | ICD-10-CM

## 2019-01-13 DIAGNOSIS — O24419 Gestational diabetes mellitus in pregnancy, unspecified control: Secondary | ICD-10-CM | POA: Insufficient documentation

## 2019-01-13 DIAGNOSIS — O10913 Unspecified pre-existing hypertension complicating pregnancy, third trimester: Secondary | ICD-10-CM

## 2019-01-13 DIAGNOSIS — Z3480 Encounter for supervision of other normal pregnancy, unspecified trimester: Secondary | ICD-10-CM

## 2019-01-13 DIAGNOSIS — Z3A36 36 weeks gestation of pregnancy: Secondary | ICD-10-CM

## 2019-01-13 DIAGNOSIS — O24410 Gestational diabetes mellitus in pregnancy, diet controlled: Secondary | ICD-10-CM

## 2019-01-13 NOTE — Progress Notes (Signed)
   TELEHEALTH VIRTUAL OBSTETRICS VISIT ENCOUNTER NOTE  I connected with [name] on 01/13/19 at  3:00 PM EDT by telephone at home and verified that I am speaking with the correct person using two identifiers.   I discussed the limitations, risks, security and privacy concerns of performing an evaluation and management service by telephone and the availability of in person appointments. I also discussed with the patient that there may be a patient responsible charge related to this service. The patient expressed understanding and agreed to proceed.  Subjective:  Mariah Livingston is a 38 y.o. G5P0040 at [redacted]w[redacted]d being followed for ongoing prenatal care.  She is currently monitored for the following issues for this high-risk pregnancy and has Essential hypertension; Supervision of other normal pregnancy, antepartum; Chronic hypertension during pregnancy, antepartum; Uterine fibroids affecting pregnancy; Abnormal glucose tolerance test (GTT) during pregnancy, antepartum; and Gestational diabetes on their problem list.  Patient reports general discomforts of pregnancy. Reports fetal movement. Denies any contractions, bleeding or leaking of fluid.   The following portions of the patient's history were reviewed and updated as appropriate: allergies, current medications, past family history, past medical history, past social history, past surgical history and problem list.   Objective:   General:  Alert, oriented and cooperative.   Mental Status: Normal mood and affect perceived. Normal judgment and thought content.  Rest of physical exam deferred due to type of encounter  Assessment and Plan:  Pregnancy: G5P0040 at [redacted]w[redacted]d 1. Supervision of other normal pregnancy, antepartum Stable GBS next visit  2. Chronic hypertension during pregnancy, antepartum BP stable without meds Home BP 120's/70's Continue to monitor Continue with weekly BBP  3. Diet controlled gestational diabetes mellitus (GDM) in  third trimester Pt reports CBG's in goal range Continue with diet  Term labor symptoms and general obstetric precautions including but not limited to vaginal bleeding, contractions, leaking of fluid and fetal movement were reviewed in detail with the patient.  I discussed the assessment and treatment plan with the patient. The patient was provided an opportunity to ask questions and all were answered. The patient agreed with the plan and demonstrated an understanding of the instructions. The patient was advised to call back or seek an in-person office evaluation/go to [facility] at [facility] for any urgent or concerning symptoms. Please refer to After Visit Summary for other counseling recommendations.   I provided 11 minutes of non-face-to-face time during this encounter.  Return in about 1 week (around 01/20/2019) for OB visit, face to face for GBS.  Future Appointments  Date Time Provider [place]  01/16/2019  3:30 PM [place] [facility]  01/16/2019  3:30 PM [place] 1 WH-MFCUS M[facility]-US  01/23/2019  3:30 PM [place] [facility]  01/23/2019  3:30 PM [place] 1 WH-MFCUS M[facility]-US  01/30/2019  3:30 PM [place] NURSE [place] [facility]  01/30/2019  3:30 PM [place] 1 WH-MFCUS MFC-US    Chancy Milroy, [place] for [employer], [place]

## 2019-01-13 NOTE — Progress Notes (Signed)
[facility] ROB visit GDM patient highest fasting (106) after meals (119)

## 2019-01-16 ENCOUNTER — Ambulatory Visit (HOSPITAL_COMMUNITY): Payer: Medicaid Other | Admitting: *Deleted

## 2019-01-16 ENCOUNTER — Ambulatory Visit (HOSPITAL_COMMUNITY)
Admission: RE | Admit: 2019-01-16 | Discharge: 2019-01-16 | Disposition: A | Discharge status: Home / Self Care | Payer: Medicaid Other | Source: Ambulatory Visit | Attending: Obstetrics and Gynecology | Admitting: Obstetrics and Gynecology

## 2019-01-16 ENCOUNTER — Other Ambulatory Visit: Payer: Self-pay

## 2019-01-16 ENCOUNTER — Encounter (HOSPITAL_COMMUNITY): Payer: Self-pay | Admitting: *Deleted

## 2019-01-16 VITALS — BP 131/83 | HR 98 | Temp 98.5°F

## 2019-01-16 DIAGNOSIS — O24410 Gestational diabetes mellitus in pregnancy, diet controlled: Secondary | ICD-10-CM

## 2019-01-16 DIAGNOSIS — I10 Essential (primary) hypertension: Secondary | ICD-10-CM | POA: Diagnosis present

## 2019-01-16 DIAGNOSIS — Z3A37 37 weeks gestation of pregnancy: Secondary | ICD-10-CM

## 2019-01-16 DIAGNOSIS — O3413 Maternal care for benign tumor of corpus uteri, third trimester: Secondary | ICD-10-CM

## 2019-01-16 DIAGNOSIS — O350XX0 Maternal care for (suspected) central nervous system malformation in fetus, not applicable or unspecified: Secondary | ICD-10-CM | POA: Diagnosis not present

## 2019-01-16 DIAGNOSIS — O0933 Supervision of pregnancy with insufficient antenatal care, third trimester: Secondary | ICD-10-CM

## 2019-01-16 DIAGNOSIS — D259 Leiomyoma of uterus, unspecified: Secondary | ICD-10-CM | POA: Diagnosis not present

## 2019-01-16 DIAGNOSIS — O10913 Unspecified pre-existing hypertension complicating pregnancy, third trimester: Secondary | ICD-10-CM | POA: Diagnosis present

## 2019-01-16 DIAGNOSIS — O10013 Pre-existing essential hypertension complicating pregnancy, third trimester: Secondary | ICD-10-CM

## 2019-01-16 DIAGNOSIS — O09523 Supervision of elderly multigravida, third trimester: Secondary | ICD-10-CM

## 2019-01-21 ENCOUNTER — Encounter: Payer: Medicaid Other | Admitting: Advanced Practice Midwife

## 2019-01-23 ENCOUNTER — Ambulatory Visit (HOSPITAL_COMMUNITY): Payer: Medicaid Other | Admitting: *Deleted

## 2019-01-23 ENCOUNTER — Encounter (HOSPITAL_COMMUNITY): Payer: Self-pay

## 2019-01-23 ENCOUNTER — Other Ambulatory Visit: Payer: Self-pay

## 2019-01-23 ENCOUNTER — Ambulatory Visit (HOSPITAL_COMMUNITY)
Admission: RE | Admit: 2019-01-23 | Discharge: 2019-01-23 | Disposition: A | Discharge status: Home / Self Care | Payer: Medicaid Other | Source: Ambulatory Visit | Attending: Obstetrics and Gynecology | Admitting: Obstetrics and Gynecology

## 2019-01-23 VITALS — BP 131/71 | HR 83 | Temp 98.7°F

## 2019-01-23 DIAGNOSIS — O3413 Maternal care for benign tumor of corpus uteri, third trimester: Secondary | ICD-10-CM

## 2019-01-23 DIAGNOSIS — O10913 Unspecified pre-existing hypertension complicating pregnancy, third trimester: Secondary | ICD-10-CM

## 2019-01-23 DIAGNOSIS — O350XX0 Maternal care for (suspected) central nervous system malformation in fetus, not applicable or unspecified: Secondary | ICD-10-CM

## 2019-01-23 DIAGNOSIS — O10919 Unspecified pre-existing hypertension complicating pregnancy, unspecified trimester: Secondary | ICD-10-CM | POA: Insufficient documentation

## 2019-01-23 DIAGNOSIS — O10013 Pre-existing essential hypertension complicating pregnancy, third trimester: Secondary | ICD-10-CM | POA: Diagnosis not present

## 2019-01-23 DIAGNOSIS — Z3A38 38 weeks gestation of pregnancy: Secondary | ICD-10-CM

## 2019-01-23 DIAGNOSIS — O0933 Supervision of pregnancy with insufficient antenatal care, third trimester: Secondary | ICD-10-CM

## 2019-01-23 DIAGNOSIS — O24410 Gestational diabetes mellitus in pregnancy, diet controlled: Secondary | ICD-10-CM

## 2019-01-23 DIAGNOSIS — O09523 Supervision of elderly multigravida, third trimester: Secondary | ICD-10-CM

## 2019-01-23 DIAGNOSIS — D259 Leiomyoma of uterus, unspecified: Secondary | ICD-10-CM

## 2019-01-26 ENCOUNTER — Ambulatory Visit (INDEPENDENT_AMBULATORY_CARE_PROVIDER_SITE_OTHER): Payer: Medicaid Other | Admitting: Advanced Practice Midwife

## 2019-01-26 ENCOUNTER — Other Ambulatory Visit: Payer: Self-pay

## 2019-01-26 ENCOUNTER — Encounter: Payer: Self-pay | Admitting: Advanced Practice Midwife

## 2019-01-26 VITALS — BP 135/86 | HR 92 | Temp 99.0°F | Wt 182.0 lb

## 2019-01-26 DIAGNOSIS — O10913 Unspecified pre-existing hypertension complicating pregnancy, third trimester: Secondary | ICD-10-CM | POA: Diagnosis not present

## 2019-01-26 DIAGNOSIS — O10919 Unspecified pre-existing hypertension complicating pregnancy, unspecified trimester: Secondary | ICD-10-CM

## 2019-01-26 DIAGNOSIS — O24410 Gestational diabetes mellitus in pregnancy, diet controlled: Secondary | ICD-10-CM | POA: Diagnosis not present

## 2019-01-26 DIAGNOSIS — Z3A38 38 weeks gestation of pregnancy: Secondary | ICD-10-CM | POA: Diagnosis not present

## 2019-01-26 DIAGNOSIS — Z3480 Encounter for supervision of other normal pregnancy, unspecified trimester: Secondary | ICD-10-CM

## 2019-01-26 NOTE — Patient Instructions (Signed)
Labor Precautions Reasons to come to MAU at [facility]:  1.  Contractions are  5 minutes apart or less, each last 1 minute, these have been going on for 1-2 hours, and you cannot walk or talk during them 2.  You have a large gush of fluid, or a trickle of fluid that will not stop and you have to wear a pad 3.  You have bleeding that is bright red, heavier than spotting--like menstrual bleeding (spotting can be normal in early labor or after a check of your cervix) 4.  You do not feel the baby moving like he/she normally does  

## 2019-01-26 NOTE — Progress Notes (Signed)
   PRENATAL VISIT NOTE  Subjective:  Mariah Livingston is a 38 y.o. G5P0040 at [redacted]w[redacted]d being seen today for ongoing prenatal care.  She is currently monitored for the following issues for this high-risk pregnancy and has Essential hypertension; Supervision of other normal pregnancy, antepartum; Chronic hypertension during pregnancy, antepartum; Uterine fibroids affecting pregnancy; Abnormal glucose tolerance test (GTT) during pregnancy, antepartum; and Gestational diabetes on their problem list.  Patient reports no complaints.  Contractions: Not present. Vag. Bleeding: None.  Movement: Present. Denies leaking of fluid.   The following portions of the patient's history were reviewed and updated as appropriate: allergies, current medications, past family history, past medical history, past social history, past surgical history and problem list.   Objective:   Vitals:   01/26/19 1612  BP: 135/86  Pulse: 92  Temp: 99 F (37.2 C)  Weight: 182 lb (82.6 kg)    Fetal Status:     Movement: Present     General:  Alert, oriented and cooperative. Patient is in no acute distress.  Skin: Skin is warm and dry. No rash noted.   Cardiovascular: Normal heart rate noted  Respiratory: Normal respiratory effort, no problems with respiration noted  Abdomen: Soft, gravid, appropriate for gestational age.  Pain/Pressure: Absent     Pelvic: Cervical exam deferred        Extremities: Normal range of motion.  Edema: None  Mental Status: Normal mood and affect. Normal behavior. Normal judgment and thought content.   Assessment and Plan:  Pregnancy: [id number] at [redacted]w[redacted]d 1. Supervision of other normal pregnancy, antepartum --Anticipatory guidance about next visits/weeks of pregnancy given. --Reviewed safety, visitor policy, reassurance about COVID-19 for pregnancy at this time. Discussed possible changes to visits, including televisits, that may occur due to COVID-19.  The office remains open if pt needs to be seen and  MAU is open 24 hours/day for OB emergencies.   --Pt initially declined GC and GBS swabs. Discussed with pt reasons for both and options.  Pt declines GC but will self swab for GBS today.  She denies any STD risks.  --Next visit in person, pt declines video or phone visits and is unable to enter BP from home  2. Chronic hypertension during pregnancy, antepartum --BP wnl, no meds in pregnancy  3. Diet controlled gestational diabetes mellitus (GDM) in third trimester --Log not reviewed but pt reports fasting and PP all wnl  Term labor symptoms and general obstetric precautions including but not limited to vaginal bleeding, contractions, leaking of fluid and fetal movement were reviewed in detail with the patient. Please refer to After Visit Summary for other counseling recommendations.   No follow-ups on file.  Future Appointments  Date Time Provider [place]  01/30/2019 11:15 AM [place] [facility]  01/30/2019 11:15 AM [place] 4 [facility]    Fatima Blank, CNM

## 2019-01-26 NOTE — Progress Notes (Signed)
Pt presents for ROB. Declines GBS/GC/CT swabs. Does not want cx check today.

## 2019-01-29 LAB — CULTURE, BETA STREP (GROUP B ONLY): Strep Gp B Culture: POSITIVE — AB

## 2019-01-30 ENCOUNTER — Ambulatory Visit (HOSPITAL_COMMUNITY): Payer: Medicaid Other | Admitting: *Deleted

## 2019-01-30 ENCOUNTER — Encounter (HOSPITAL_COMMUNITY): Payer: Self-pay | Admitting: Anesthesiology

## 2019-01-30 ENCOUNTER — Ambulatory Visit (HOSPITAL_COMMUNITY)
Admission: RE | Admit: 2019-01-30 | Discharge: 2019-01-30 | Disposition: A | Discharge status: Home / Self Care | Payer: Medicaid Other | Source: Ambulatory Visit | Attending: Obstetrics and Gynecology | Admitting: Obstetrics and Gynecology

## 2019-01-30 ENCOUNTER — Encounter: Payer: Self-pay | Admitting: Obstetrics and Gynecology

## 2019-01-30 ENCOUNTER — Ambulatory Visit (HOSPITAL_COMMUNITY): Payer: Medicaid Other

## 2019-01-30 ENCOUNTER — Other Ambulatory Visit: Payer: Self-pay | Admitting: Obstetrics and Gynecology

## 2019-01-30 ENCOUNTER — Inpatient Hospital Stay (HOSPITAL_COMMUNITY)
Admission: AD | Admit: 2019-01-30 | Discharge: 2019-02-06 | DRG: 787 | Disposition: A | Discharge status: Home / Self Care | Payer: Medicaid Other | Attending: Obstetrics & Gynecology | Admitting: Obstetrics & Gynecology

## 2019-01-30 ENCOUNTER — Encounter (HOSPITAL_COMMUNITY): Payer: Self-pay | Admitting: *Deleted

## 2019-01-30 ENCOUNTER — Encounter (HOSPITAL_COMMUNITY)
Admission: AD | Disposition: A | Discharge status: Home / Self Care | Payer: Self-pay | Source: Home / Self Care | Attending: Obstetrics and Gynecology

## 2019-01-30 ENCOUNTER — Encounter (HOSPITAL_COMMUNITY): Payer: Self-pay

## 2019-01-30 ENCOUNTER — Telehealth: Payer: Self-pay | Admitting: Obstetrics and Gynecology

## 2019-01-30 ENCOUNTER — Other Ambulatory Visit: Payer: Self-pay

## 2019-01-30 VITALS — BP 135/76 | HR 85 | Temp 98.6°F

## 2019-01-30 DIAGNOSIS — O4103X0 Oligohydramnios, third trimester, not applicable or unspecified: Principal | ICD-10-CM | POA: Diagnosis present

## 2019-01-30 DIAGNOSIS — O4100X0 Oligohydramnios, unspecified trimester, not applicable or unspecified: Secondary | ICD-10-CM | POA: Diagnosis present

## 2019-01-30 DIAGNOSIS — Z98891 History of uterine scar from previous surgery: Secondary | ICD-10-CM

## 2019-01-30 DIAGNOSIS — O09523 Supervision of elderly multigravida, third trimester: Secondary | ICD-10-CM

## 2019-01-30 DIAGNOSIS — O99820 Streptococcus B carrier state complicating pregnancy: Secondary | ICD-10-CM

## 2019-01-30 DIAGNOSIS — O10013 Pre-existing essential hypertension complicating pregnancy, third trimester: Secondary | ICD-10-CM | POA: Diagnosis not present

## 2019-01-30 DIAGNOSIS — O24410 Gestational diabetes mellitus in pregnancy, diet controlled: Secondary | ICD-10-CM

## 2019-01-30 DIAGNOSIS — Z1159 Encounter for screening for other viral diseases: Secondary | ICD-10-CM | POA: Diagnosis not present

## 2019-01-30 DIAGNOSIS — O329XX0 Maternal care for malpresentation of fetus, unspecified, not applicable or unspecified: Secondary | ICD-10-CM | POA: Diagnosis present

## 2019-01-30 DIAGNOSIS — Z3A39 39 weeks gestation of pregnancy: Secondary | ICD-10-CM

## 2019-01-30 DIAGNOSIS — O3413 Maternal care for benign tumor of corpus uteri, third trimester: Secondary | ICD-10-CM

## 2019-01-30 DIAGNOSIS — O114 Pre-existing hypertension with pre-eclampsia, complicating childbirth: Secondary | ICD-10-CM | POA: Diagnosis present

## 2019-01-30 DIAGNOSIS — O350XX0 Maternal care for (suspected) central nervous system malformation in fetus, not applicable or unspecified: Secondary | ICD-10-CM

## 2019-01-30 DIAGNOSIS — O99214 Obesity complicating childbirth: Secondary | ICD-10-CM | POA: Diagnosis present

## 2019-01-30 DIAGNOSIS — O24420 Gestational diabetes mellitus in childbirth, diet controlled: Secondary | ICD-10-CM | POA: Diagnosis present

## 2019-01-30 DIAGNOSIS — O0933 Supervision of pregnancy with insufficient antenatal care, third trimester: Secondary | ICD-10-CM

## 2019-01-30 DIAGNOSIS — O10919 Unspecified pre-existing hypertension complicating pregnancy, unspecified trimester: Secondary | ICD-10-CM | POA: Insufficient documentation

## 2019-01-30 DIAGNOSIS — O1002 Pre-existing essential hypertension complicating childbirth: Secondary | ICD-10-CM | POA: Diagnosis present

## 2019-01-30 DIAGNOSIS — O0930 Supervision of pregnancy with insufficient antenatal care, unspecified trimester: Secondary | ICD-10-CM | POA: Insufficient documentation

## 2019-01-30 DIAGNOSIS — Z7982 Long term (current) use of aspirin: Secondary | ICD-10-CM

## 2019-01-30 DIAGNOSIS — O9081 Anemia of the puerperium: Secondary | ICD-10-CM | POA: Diagnosis not present

## 2019-01-30 DIAGNOSIS — O24419 Gestational diabetes mellitus in pregnancy, unspecified control: Secondary | ICD-10-CM | POA: Diagnosis present

## 2019-01-30 DIAGNOSIS — O10913 Unspecified pre-existing hypertension complicating pregnancy, third trimester: Secondary | ICD-10-CM | POA: Insufficient documentation

## 2019-01-30 DIAGNOSIS — O1093 Unspecified pre-existing hypertension complicating the puerperium: Secondary | ICD-10-CM | POA: Diagnosis present

## 2019-01-30 DIAGNOSIS — D259 Leiomyoma of uterus, unspecified: Secondary | ICD-10-CM | POA: Diagnosis present

## 2019-01-30 DIAGNOSIS — O99824 Streptococcus B carrier state complicating childbirth: Secondary | ICD-10-CM | POA: Diagnosis present

## 2019-01-30 DIAGNOSIS — O115 Pre-existing hypertension with pre-eclampsia, complicating the puerperium: Secondary | ICD-10-CM | POA: Diagnosis present

## 2019-01-30 DIAGNOSIS — O1092 Unspecified pre-existing hypertension complicating childbirth: Secondary | ICD-10-CM | POA: Diagnosis not present

## 2019-01-30 LAB — CBC
HCT: 31.7 % — ABNORMAL LOW (ref 36.0–46.0)
Hemoglobin: 10.3 g/dL — ABNORMAL LOW (ref 12.0–15.0)
MCH: 25.9 pg — ABNORMAL LOW (ref 26.0–34.0)
MCHC: 32.5 g/dL (ref 30.0–36.0)
MCV: 79.8 fL — ABNORMAL LOW (ref 80.0–100.0)
Platelets: 180 10*3/uL (ref 150–400)
RBC: 3.97 MIL/uL (ref 3.87–5.11)
RDW: 16.2 % — ABNORMAL HIGH (ref 11.5–15.5)
WBC: 8.2 10*3/uL (ref 4.0–10.5)
nRBC: 0 % (ref 0.0–0.2)

## 2019-01-30 LAB — COMPREHENSIVE METABOLIC PANEL
ALT: 28 U/L (ref 0–44)
AST: 26 U/L (ref 15–41)
Albumin: 3 g/dL — ABNORMAL LOW (ref 3.5–5.0)
Alkaline Phosphatase: 130 U/L — ABNORMAL HIGH (ref 38–126)
Anion gap: 14 (ref 5–15)
BUN: 9 mg/dL (ref 6–20)
CO2: 17 mmol/L — ABNORMAL LOW (ref 22–32)
Calcium: 9.2 mg/dL (ref 8.9–10.3)
Chloride: 109 mmol/L (ref 98–111)
Creatinine, Ser: 0.67 mg/dL (ref 0.44–1.00)
GFR calc Af Amer: >60 mL/min (ref >60)
GFR calc non Af Amer: >60 mL/min (ref >60)
Glucose, Bld: 75 mg/dL (ref 70–99)
Potassium: 3.3 mmol/L — ABNORMAL LOW (ref 3.5–5.1)
Sodium: 140 mmol/L (ref 135–145)
Total Bilirubin: 0.8 mg/dL (ref 0.3–1.2)
Total Protein: 6.7 g/dL (ref 6.5–8.1)

## 2019-01-30 LAB — SARS CORONAVIRUS 2 BY RT PCR (HOSPITAL ORDER, PERFORMED IN ~~LOC~~ HOSPITAL LAB): SARS Coronavirus 2: NEGATIVE

## 2019-01-30 LAB — GLUCOSE, CAPILLARY: Glucose-Capillary: 72 mg/dL (ref 70–99)

## 2019-01-30 SURGERY — Surgical Case
Anesthesia: Regional

## 2019-01-30 MED ORDER — ACETAMINOPHEN 325 MG PO TABS: 650.0000 mg | ORAL_TABLET | ORAL | Status: DC | PRN

## 2019-01-30 MED ORDER — ZOLPIDEM TARTRATE 5 MG PO TABS
5.0000 mg | ORAL_TABLET | Freq: Every evening | ORAL | Status: DC | PRN
  Administered 2019-01-30: 5 mg via ORAL
  Filled 2019-01-30: qty 1

## 2019-01-30 MED ORDER — TERBUTALINE SULFATE 1 MG/ML IJ SOLN: 0.2500 mg | Freq: Once | INTRAMUSCULAR | Status: DC | PRN

## 2019-01-30 MED ORDER — MISOPROSTOL 25 MCG QUARTER TABLET
ORAL_TABLET | ORAL | Status: AC
  Filled 2019-01-30: qty 1

## 2019-01-30 MED ORDER — LACTATED RINGERS IV SOLN
500.0000 mL | INTRAVENOUS | Status: DC | PRN
  Administered 2019-01-31: 500 mL via INTRAVENOUS
  Administered 2019-01-31: 1000 mL via INTRAVENOUS

## 2019-01-30 MED ORDER — SODIUM CHLORIDE 0.9 % IV SOLN
5.0000 10*6.[IU] | Freq: Once | INTRAVENOUS | Status: AC
  Administered 2019-01-30: 5 10*6.[IU] via INTRAVENOUS
  Filled 2019-01-30: qty 5

## 2019-01-30 MED ORDER — OXYTOCIN 40 UNITS IN NORMAL SALINE INFUSION - SIMPLE MED
2.5000 [IU]/h | INTRAVENOUS | Status: DC
  Filled 2019-01-30: qty 1000

## 2019-01-30 MED ORDER — CEFAZOLIN SODIUM-DEXTROSE 2-4 GM/100ML-% IV SOLN: 2.0000 g | INTRAVENOUS | Status: DC

## 2019-01-30 MED ORDER — LACTATED RINGERS IV SOLN
INTRAVENOUS | Status: DC
  Administered 2019-01-30 – 2019-02-01 (×7): via INTRAVENOUS

## 2019-01-30 MED ORDER — OXYTOCIN BOLUS FROM INFUSION: 500.0000 mL | Freq: Once | INTRAVENOUS | Status: DC

## 2019-01-30 MED ORDER — LIDOCAINE HCL (PF) 1 % IJ SOLN: 30.0000 mL | INTRAMUSCULAR | Status: DC | PRN

## 2019-01-30 MED ORDER — POTASSIUM CHLORIDE CRYS ER 20 MEQ PO TBCR
30.0000 meq | EXTENDED_RELEASE_TABLET | Freq: Two times a day (BID) | ORAL | Status: DC
  Administered 2019-01-30 – 2019-01-31 (×2): 30 meq via ORAL
  Filled 2019-01-30 (×3): qty 1

## 2019-01-30 MED ORDER — PENICILLIN G 3 MILLION UNITS IVPB - SIMPLE MED
3.0000 10*6.[IU] | INTRAVENOUS | Status: DC
  Administered 2019-01-30 – 2019-01-31 (×7): 3 10*6.[IU] via INTRAVENOUS
  Filled 2019-01-30 (×7): qty 100

## 2019-01-30 MED ORDER — MISOPROSTOL 25 MCG QUARTER TABLET
25.0000 ug | ORAL_TABLET | ORAL | Status: DC | PRN
  Administered 2019-01-30: 25 ug via VAGINAL

## 2019-01-30 MED ORDER — FENTANYL CITRATE (PF) 100 MCG/2ML IJ SOLN
50.0000 ug | INTRAMUSCULAR | Status: AC | PRN
  Administered 2019-01-30 – 2019-01-31 (×3): 100 ug via INTRAVENOUS
  Filled 2019-01-30 (×3): qty 2

## 2019-01-30 MED ORDER — SOD CITRATE-CITRIC ACID 500-334 MG/5ML PO SOLN: 30.0000 mL | ORAL | Status: DC | PRN

## 2019-01-30 MED ORDER — SOD CITRATE-CITRIC ACID 500-334 MG/5ML PO SOLN: 30.0000 mL | ORAL | Status: DC

## 2019-01-30 MED ORDER — MISOPROSTOL 50MCG HALF TABLET
50.0000 ug | ORAL_TABLET | ORAL | Status: DC | PRN
  Administered 2019-01-30 – 2019-01-31 (×2): 50 ug via BUCCAL
  Filled 2019-01-30 (×2): qty 1

## 2019-01-30 MED ORDER — ONDANSETRON HCL 4 MG/2ML IJ SOLN
4.0000 mg | Freq: Four times a day (QID) | INTRAMUSCULAR | Status: DC | PRN
  Administered 2019-01-31 (×2): 4 mg via INTRAVENOUS
  Filled 2019-01-30 (×2): qty 2

## 2019-01-30 NOTE — H&P (Signed)
OBSTETRIC ADMISSION HISTORY AND PHYSICAL  01/30/2019 Clinic: Mariah Livingston is a 38 y.o. female [id number] with IUP at 32w2dpresenting for IOL for oligohydramnios at term. PMHx significant for AMA, cHTN (no meds), GDMA1), GBS positive.   Patient went for routine AP surveillance testing and had bpp 6/8 (-2 for oligo at AFI 1.8), and was transverse.    Reports fetal movement and denies any labor s/s and no LOF and no s/s of pre-eclampsia   Ultrasounds . 5/14: efw 77%, 3018gm, ac 68%, normal AF   Past Medical History: Past Medical History:  Diagnosis Date  . Gestational diabetes   . Hypertension   . Miscarriage     Past Surgical History: Past Surgical History:  Procedure Laterality Date  . COSMETIC SURGERY     abdominal liposuction  . HERNIA REPAIR  2015   5-6 years     Obstetrical History: OB History    Gravida  5   Para  0   Term  0   Preterm  0   AB  4   Living  0     SAB  1   TAB  3   Ectopic  0   Multiple  0   Live Births              Social History: Social History   Socioeconomic History  . Marital status: Single    Spouse name: Not on file  . Number of children: Not on file  . Years of education: Not on file  . Highest education level: Not on file  Occupational History  . Not on file  Social Needs  . Financial resource strain: Not on file  . Food insecurity:    Worry: Not on file    Inability: Not on file  . Transportation needs:    Medical: Not on file    Non-medical: Not on file  Tobacco Use  . Smoking status: Never Smoker  . Smokeless tobacco: Never Used  Substance and Sexual Activity  . Alcohol use: Not Currently    Alcohol/week: 1.0 standard drinks    Types: 1 Cans of beer per week    Comment: 1-2 cans daily of beer, more on weekend  . Drug use: No  . Sexual activity: Yes    Birth control/protection: None  Lifestyle  . Physical activity:    Days per week: Not on file    Minutes per session: Not on file  .  Stress: Not on file  Relationships  . Social connections:    Talks on phone: Not on file    Gets together: Not on file    Attends religious service: Not on file    Active member of club or organization: Not on file    Attends meetings of clubs or organizations: Not on file    Relationship status: Not on file  Other Topics Concern  . Not on file  Social History Narrative  . Not on file    Family History: Family History  Problem Relation Age of Onset  . Hypertension Mother     Allergies: No Known Allergies  Medications Prior to Admission  Medication Sig Dispense Refill Last Dose  . Accu-Chek FastClix Lancets MISC 1 Container by Percutaneous route 4 (four) times daily. Test sugars AM Fasting and 2 hours after every meal. 100 each 12 01/30/2019 at Unknown time  . Acetaminophen (TYLENOL PO) Take by mouth.   Past Month at Unknown time  . Blood Glucose Monitoring  Suppl (ACCU-CHEK GUIDE ME) w/Device KIT 1 Device by Does not apply route 4 (four) times daily. Test sugars AM Fasting and 2 hours after each meal. 1 kit 0 01/30/2019 at Unknown time  . Ferrous Sulfate (IRON PO) Take by mouth.   Past Month at Unknown time  . glucose blood test strip Test sugars AM Fasting and 2 hours after each meal. Use as instructed 100 each 12 01/30/2019 at Unknown time  . Prenatal Vit-Fe Fumarate-FA (PRENATAL COMPLETE) 14-0.4 MG TABS Take 1 tablet by mouth daily. 60 each 1 01/30/2019 at Unknown time  . aspirin EC 81 MG tablet Take 1 tablet (81 mg total) by mouth daily. (Patient not taking: Reported on 12/11/2018) 100 tablet 1 Unknown at Unknown time  . OVER THE COUNTER MEDICATION OTC allergy medications as needed.   Unknown at Unknown time  . ranitidine (ZANTAC) 150 MG capsule Take 1 capsule (150 mg total) by mouth 2 (two) times daily. (Patient not taking: Reported on 12/11/2018) 28 capsule 0 Unknown at Unknown time     Review of Systems  All systems reviewed and negative except as stated in HPI  Blood pressure  (!) 146/92, pulse 88, temperature 99 F (37.2 C), temperature source Oral, resp. rate 18, height _0  (1.676 m), weight 86.6 kg, last menstrual period 04/30/2018, SpO2 99 %, unknown if currently breastfeeding. Category I tracing with accels Rare UCs  NAD CTAB Normal s1, s2, no MRGs Gravid, nttp SVE: FT/long/high  Bedside U/S: oligo, normal FHR, fetal head at 6:30 with spine up legs in LUQ     Prenatal labs: ABO, Rh: --/--/PENDING (06/05 1636)  Antibody: PENDING (06/05 1636) Rubella: >33.00 (04/07 1641) RPR: Non Reactive (04/21 1033)  HBsAg: Negative (04/07 1641)  HIV: Non Reactive (04/21 1033)  GBS:   positive   Results for orders placed or performed during the hospital encounter of 01/30/19 (from the past 24 hour(s))  CBC   Collection Time: 01/30/19  4:36 PM  Result Value Ref Range   WBC 8.2 4.0 - 10.5 K/uL   RBC 3.97 3.87 - 5.11 MIL/uL   Hemoglobin 10.3 (L) 12.0 - 15.0 g/dL   HCT 31.7 (L) 36.0 - 46.0 %   MCV 79.8 (L) 80.0 - 100.0 fL   MCH 25.9 (L) 26.0 - 34.0 pg   MCHC 32.5 30.0 - 36.0 g/dL   RDW 16.2 (H) 11.5 - 15.5 %   Platelets 180 150 - 400 K/uL   nRBC 0.0 0.0 - 0.2 %  Comprehensive metabolic panel   Collection Time: 01/30/19  4:36 PM  Result Value Ref Range   Sodium 140 135 - 145 mmol/L   Potassium 3.3 (L) 3.5 - 5.1 mmol/L   Chloride 109 98 - 111 mmol/L   CO2 17 (L) 22 - 32 mmol/L   Glucose, Bld 75 70 - 99 mg/dL   BUN 9 6 - 20 mg/dL   Creatinine, Ser 0.67 0.44 - 1.00 mg/dL   Calcium 9.2 8.9 - 10.3 mg/dL   Total Protein 6.7 6.5 - 8.1 g/dL   Albumin 3.0 (L) 3.5 - 5.0 g/dL   AST 26 15 - 41 U/L   ALT 28 0 - 44 U/L   Alkaline Phosphatase 130 (H) 38 - 126 U/L   Total Bilirubin 0.8 0.3 - 1.2 mg/dL   GFR calc non Af Amer >60 >60 mL/min   GFR calc Af Amer >60 >60 mL/min   Anion gap 14 5 - 15  Type and screen   Collection  Time: 01/30/19  4:36 PM  Result Value Ref Range   ABO/RH(D) PENDING    Antibody Screen PENDING    Sample Expiration       02/02/2019,2359 Performed at [facility], [place] [address], [place], [place] [address]   Glucose, capillary   Collection Time: 01/30/19  5:17 PM  Result Value Ref Range   Glucose-Capillary 72 70 - 99 mg/dL   Comment 1 Notify RN     Patient Active Problem List   Diagnosis Date Noted  . Late prenatal care affecting pregnancy 01/30/2019  . Malpresentation before onset of labor 01/30/2019  . GBS (group B Streptococcus carrier), +RV culture, currently pregnant 01/30/2019  . Gestational diabetes 01/13/2019  . Abnormal glucose tolerance test (GTT) during pregnancy, antepartum 12/31/2018  . Uterine fibroids affecting pregnancy 12/16/2018  . Chronic hypertension during pregnancy, antepartum 12/02/2018  . Supervision of other normal pregnancy, antepartum 12/01/2018  . Essential hypertension 08/16/2014    Assessment/Plan:  Mariah Livingston is a 38 y.o. G5P0040 at 86w2dhere for oligohydramnios. Fetus transverse during BPP today but is now vertex with fetal head on maternal right.   *Pregnancy: fetal status reassuring *IOL: Will start with cytotec and abdominal binder and straighten up patient's belly. Hopefully with contractions and binder will wedge head down further.  *cHTN: will get baseline pre-x labs *gdma1: normal BS on cmp. No need for further checks *GBS pos: start abx now *analgesia: no current needs. Option to epidural d/w pt if she desires.   CDurene RomansMD Attending Center [facility] [employer](Faculty Practice) 01/30/2019 Time: [phone]

## 2019-01-30 NOTE — Telephone Encounter (Signed)
OB Telephone Note Called by [facility] Nurse. Patient called stating she wasn't going to come until midnight when husband gets off work. I relayed to her to please let the patient know that she needs to come to the hospital for hospital evaluation and if baby looks fine that we can delay delivery, whether ecv attempt or do a primary c-section, until husband is present. Importance to come for fetal evaluation d/w and stay NPO. [facility] called patient and no answer and message left.  Durene Romans MD Attending Center for [employer] (Faculty Practice) 01/30/2019 Time: [phone]

## 2019-01-30 NOTE — Progress Notes (Signed)
Labor Progress Note  Subjective: In to introduce self to patient. Pt is doing well. Pt has no complaints at this time.   Objective: BP (!) 139/95   Pulse 87   Temp 98.4 F (36.9 C) (Oral)   Resp 18   Ht 5\' 6"  (1.676 m)   Wt 86.6 kg   LMP 04/30/2018 (Approximate)   SpO2 99%   BMI 30.83 kg/m  Gen: well appearing; no distress Dilation: Fingertip Effacement (%): 50 Station: -2 Presentation: Vertex Exam by:: m wilkins rnc  Assessment and Plan: 38 y.o. [id number] [redacted]w[redacted]d admitted for IOL for oligohydramnios (AFI <2) and unstable lie.   Labor:  -- Cytotec -- Pre-e labs pending -- Abdominal binder for unstable lie -- PO potassium for hypokalemia -- PCN x2 doses for +GBS -- Pain control: none at this time -- PPH Risk: medium  Fetal Well-Being:  -- EFW 3018g by ultrasound on 01/08/19.  -- Category 1; FHR 135; moderate variability; +accels, no decels -- Continuous fetal monitoring   Maryagnes Amos, SNM 10:32 PM

## 2019-01-31 DIAGNOSIS — O4103X0 Oligohydramnios, third trimester, not applicable or unspecified: Secondary | ICD-10-CM

## 2019-01-31 DIAGNOSIS — Z3A39 39 weeks gestation of pregnancy: Secondary | ICD-10-CM

## 2019-01-31 DIAGNOSIS — O24410 Gestational diabetes mellitus in pregnancy, diet controlled: Secondary | ICD-10-CM

## 2019-01-31 DIAGNOSIS — O10913 Unspecified pre-existing hypertension complicating pregnancy, third trimester: Secondary | ICD-10-CM

## 2019-01-31 DIAGNOSIS — O99820 Streptococcus B carrier state complicating pregnancy: Secondary | ICD-10-CM

## 2019-01-31 LAB — PROTEIN / CREATININE RATIO, URINE
Creatinine, Urine: 61.99 mg/dL
Creatinine, Urine: 36.25 mg/dL
Protein Creatinine Ratio: 0.47 mg/mg{Cre} — ABNORMAL HIGH (ref 0.00–0.15)
Protein Creatinine Ratio: 0.25 mg/mg{Cre} — ABNORMAL HIGH (ref 0.00–0.15)
Total Protein, Urine: 29 mg/dL
Total Protein, Urine: 9 mg/dL

## 2019-01-31 LAB — RPR: RPR Ser Ql: NONREACTIVE

## 2019-01-31 LAB — CBC
HCT: 33.7 % — ABNORMAL LOW (ref 36.0–46.0)
Hemoglobin: 10.9 g/dL — ABNORMAL LOW (ref 12.0–15.0)
MCH: 25.6 pg — ABNORMAL LOW (ref 26.0–34.0)
MCHC: 32.3 g/dL (ref 30.0–36.0)
MCV: 79.1 fL — ABNORMAL LOW (ref 80.0–100.0)
Platelets: 175 10*3/uL (ref 150–400)
RBC: 4.26 MIL/uL (ref 3.87–5.11)
RDW: 16.3 % — ABNORMAL HIGH (ref 11.5–15.5)
WBC: 11.1 10*3/uL — ABNORMAL HIGH (ref 4.0–10.5)
nRBC: 0 % (ref 0.0–0.2)

## 2019-01-31 LAB — HEPATITIS C ANTIBODY (REFLEX): HCV Ab: <0.1 s/co ratio (ref 0.0–0.9)

## 2019-01-31 LAB — HCV COMMENT:

## 2019-01-31 MED ORDER — EPHEDRINE 5 MG/ML INJ: 10.0000 mg | INTRAVENOUS | Status: DC | PRN

## 2019-01-31 MED ORDER — FENTANYL CITRATE (PF) 100 MCG/2ML IJ SOLN
100.0000 ug | INTRAMUSCULAR | Status: DC | PRN
  Administered 2019-01-31 (×3): 100 ug via INTRAVENOUS
  Filled 2019-01-31 (×3): qty 2

## 2019-01-31 MED ORDER — TERBUTALINE SULFATE 1 MG/ML IJ SOLN: 0.2500 mg | Freq: Once | INTRAMUSCULAR | Status: DC | PRN

## 2019-01-31 MED ORDER — PHENYLEPHRINE 40 MCG/ML (10ML) SYRINGE FOR IV PUSH (FOR BLOOD PRESSURE SUPPORT): 80.0000 ug | PREFILLED_SYRINGE | INTRAVENOUS | Status: DC | PRN

## 2019-01-31 MED ORDER — LACTATED RINGERS IV SOLN
500.0000 mL | Freq: Once | INTRAVENOUS | Status: AC
  Administered 2019-01-31: 500 mL via INTRAVENOUS

## 2019-01-31 MED ORDER — OXYTOCIN 40 UNITS IN NORMAL SALINE INFUSION - SIMPLE MED
1.0000 m[IU]/min | INTRAVENOUS | Status: DC
  Administered 2019-01-31 (×2): 2 m[IU]/min via INTRAVENOUS

## 2019-01-31 MED ORDER — PHENYLEPHRINE 40 MCG/ML (10ML) SYRINGE FOR IV PUSH (FOR BLOOD PRESSURE SUPPORT)
80.0000 ug | PREFILLED_SYRINGE | INTRAVENOUS | Status: DC | PRN
  Filled 2019-01-31: qty 10

## 2019-01-31 MED ORDER — LACTATED RINGERS AMNIOINFUSION
INTRAVENOUS | Status: DC
  Administered 2019-01-31: via INTRAUTERINE

## 2019-01-31 MED ORDER — SODIUM CHLORIDE (PF) 0.9 % IJ SOLN
INTRAMUSCULAR | Status: DC | PRN
  Administered 2019-01-31: 14 mL/h via EPIDURAL

## 2019-01-31 MED ORDER — FENTANYL-BUPIVACAINE-NACL 0.5-0.125-0.9 MG/250ML-% EP SOLN
12.0000 mL/h | EPIDURAL | Status: DC | PRN
  Filled 2019-01-31: qty 250

## 2019-01-31 MED ORDER — LIDOCAINE HCL (PF) 1 % IJ SOLN
INTRAMUSCULAR | Status: DC | PRN
  Administered 2019-01-31: 5 mL via EPIDURAL

## 2019-01-31 MED ORDER — LACTATED RINGERS IV SOLN: 500.0000 mL | Freq: Once | INTRAVENOUS | Status: DC

## 2019-01-31 MED ORDER — DIPHENHYDRAMINE HCL 50 MG/ML IJ SOLN: 12.5000 mg | INTRAMUSCULAR | Status: DC | PRN

## 2019-01-31 MED ORDER — FENTANYL-BUPIVACAINE-NACL 0.5-0.125-0.9 MG/250ML-% EP SOLN: 12.0000 mL/h | EPIDURAL | Status: DC | PRN

## 2019-01-31 NOTE — Progress Notes (Signed)
Interval Progress Note  Call from RN regarding Cat II tracing - deep variables and late decelerations with last several contractions. Still with good variability. Will try amnioinfusion and reassess. Continue to monitor closely.  Lambert Mody. Juleen China, DO OB/GYN Fellow

## 2019-01-31 NOTE — Progress Notes (Signed)
Interval Progress Note  Into room to evaluate patient given about 30 minutes of recurrent deep variables, late decelerations. Exam unchanged. Amnioinfusion now running, pitocin turned down from 10 to 6. Persistent Cat II tracing but variability remains excellent, occasional accelerations even. Suspect asynclitism but unable to fully evaluate position on exam, does have molding. Will call to update Dr. Rip Harbour but continue to monitor closely. Next step if no improvement in tracing would be to stop pitocin completely.   Lambert Mody. Juleen China, DO OB/GYN Fellow

## 2019-01-31 NOTE — Progress Notes (Signed)
Labor Progress Note  Subjective: In to check on patient and discuss foley balloon placement with patient. Pt is resting comfortably after receiving IV pain medication  Objective: BP 128/68 (BP Location: Left Arm)   Pulse 83   Temp 98.4 F (36.9 C) (Oral)   Resp 20   Ht 5\' 6"  (1.676 m)   Wt 86.6 kg   LMP 04/30/2018 (Approximate)   SpO2 100%   BMI 30.83 kg/m  Gen: no distress Dilation: 1.5 Effacement (%): 50 Cervical Position: Anterior Station: -2 Presentation: Vertex Exam by:: Rogers RN  Assessment and Plan: 38 y.o. [id number] [redacted]w[redacted]d admitted for IOL for oligo and unstable lie.   Labor:  -- Continue with Cytotec -- Foley balloon placed at 0450 -- Pain control: IV pain medication -- PPH Risk: medium  Fetal Well-Being:  -- Cephalic by cervical exam.  -- Category 1; moderate variability, +accels, no decels -- Continuous fetal monitoring  -- GBS positive; has received PCN x3 doses  Maryagnes Amos, SNM 4:52 AM

## 2019-01-31 NOTE — Anesthesia Preprocedure Evaluation (Signed)
Anesthesia Evaluation  Patient identified by MRN, date of birth, ID band Patient awake    Reviewed: Allergy & Precautions, Patient's Chart, lab work & pertinent test results  Airway Mallampati: I       Dental   Pulmonary    Pulmonary exam normal        Cardiovascular hypertension, Pt. on medications Normal cardiovascular exam     Neuro/Psych    GI/Hepatic GERD  Medicated,  Endo/Other  diabetes  Renal/GU      Musculoskeletal   Abdominal   Peds  Hematology   Anesthesia Other Findings   Reproductive/Obstetrics (+) Pregnancy                             Anesthesia Physical Anesthesia Plan  ASA: II  Anesthesia Plan: Epidural   Post-op Pain Management:    Induction:   PONV Risk Score and Plan:   Airway Management Planned:   Additional Equipment: None  Intra-op Plan:   Post-operative Plan:   Informed Consent: I have reviewed the patients History and Physical, chart, labs and discussed the procedure including the risks, benefits and alternatives for the proposed anesthesia with the patient or authorized representative who has indicated his/her understanding and acceptance.       Plan Discussed with:   Anesthesia Plan Comments: (Lab Results      Component                Value               Date                      WBC                      11.1 (H)            01/31/2019                HGB                      10.9 (L)            01/31/2019                HCT                      33.7 (L)            01/31/2019                MCV                      79.1 (L)            01/31/2019                PLT                      175                 01/31/2019            COVID-19 Labs  No results for input(s): DDIMER, FERRITIN, LDH, CRP in the last 72 hours.  Lab Results      Component                Value  Date                      [place]              NEGATIVE             01/30/2019            )        Anesthesia Quick Evaluation

## 2019-01-31 NOTE — Progress Notes (Signed)
Mariah Livingston is a 38 y.o. G5P0040 at [redacted]w[redacted]d   Subjective: S/p foley bulb, coping well with contractions. Undecided about epidural, reporting pain 7/10. S/p IV Fentanyl.  Objective: BP (!) 148/86   Pulse 76   Temp 98 F (36.7 C) (Oral)   Resp 18   Ht 5\' 6"  (1.676 m)   Wt 86.6 kg   LMP 04/30/2018 (Approximate)   SpO2 100%   BMI 30.83 kg/m  No intake/output data recorded. No intake/output data recorded.  FHT:  FHR: 140 bpm, variability: moderate,  accelerations:  Present,  decelerations:  Absent UC:   irregular, every 2-5 minutes SVE:   Dilation: 5 Effacement (%): 70 Station: -2 Exam by:: sam weinhold  Labs: Lab Results  Component Value Date   WBC 8.2 01/30/2019   HGB 10.3 (L) 01/30/2019   HCT 31.7 (L) 01/30/2019   MCV 79.8 (L) 01/30/2019   PLT 180 01/30/2019    Assessment / Plan: 38 y.o. [id number] at [redacted]w[redacted]d  Previously Cat II tracing with late decels, resolved with repositioning. Now Category I Observe for cervical change without augmentation. Discussed Pitocin augmentation PRN Pt may have epidural upon request Anticipate NSVD  Darlina Rumpf, CNM 01/31/2019,  0940 AM

## 2019-01-31 NOTE — Progress Notes (Signed)
Mariah Livingston is a 38 y.o. G5P0040 at [redacted]w[redacted]d admitted for induction of labor due to oligo.  Subjective: S/p epidural. Denies pain or perineal pressure  Objective: BP 130/83   Pulse 80   Temp 97.7 F (36.5 C) (Oral)   Resp 20   Ht 5\' 6"  (1.676 m)   Wt 86.6 kg   LMP 04/30/2018 (Approximate)   SpO2 100%   BMI 30.83 kg/m  No intake/output data recorded. Total I/O In: -  Out: 600 [Urine:600]  FHT:  FHR: 135 bpm, variability: moderate,  accelerations:  Present,  decelerations:  Present Variable and Late with good variability UC:   irregular, every 2-4 minutes SVE:   Dilation: 5.5 Effacement (%): 70 Station: -2 Exam by:: [employer] CNM  Labs: Lab Results  Component Value Date   WBC 11.1 (H) 01/31/2019   HGB 10.9 (L) 01/31/2019   HCT 33.7 (L) 01/31/2019   MCV 79.1 (L) 01/31/2019   PLT 175 01/31/2019    Assessment / Plan: Questionable SROM at noon per RN Forebag ruptured at 1645, IUPC placed Category II tracing CNM at bedside to evaluate Pitocin off at 1645 for recurrent late decels after brief period of tachysystole IV fluid bolus ordered, patient repositioned to far left lateral Dr. Rip Harbour made aware of labor course, tracing and interventions  Darlina Rumpf, CNM 01/31/2019, 4:53 PM

## 2019-01-31 NOTE — Progress Notes (Signed)
OR made aware

## 2019-01-31 NOTE — Progress Notes (Signed)
OB/GYN Faculty Practice: Labor Progress Note  Subjective: Doing well, tired but comfortable with epidural. Support person gone for evening but planning to come back in morning. No other complaints.   Objective: BP 127/72   Pulse 89   Temp 98.5 F (36.9 C) (Oral)   Resp 16   Ht 5\' 6"  (1.676 m)   Wt 86.6 kg   LMP 04/30/2018 (Approximate)   SpO2 100%   BMI 30.83 kg/m  Gen: tired-appearing Dilation: 5 Effacement (%): 70 Cervical Position: Anterior Station: -2 Presentation: Vertex Exam by:: Wallace MD  Assessment and Plan: 38 y.o. [id number] [redacted]w[redacted]d here for IOL for oligohydramnios, unstable lie.   Labor: Induction started yesterday late afternoon/early evening. Had 3 doses of cytotec and FB then started on pitocin around 1300 today. Cervical exam essentially unchanged since check at 0900 this morning. SROM around 1230. Short pit break because of recurrent late/variable decelerations but back on. Recent run of similar Cat II tracing but now improved with repositioning. IUPC showing adequate MVUs now with pitocin at 8. Will continue to monitor closely.  -- pain control: epidural in place -- PPH Risk: medium   Fetal Well-Being: EFW 3018g (77%) at 36w1. Cephalic by sutures.  -- Category I - continuous fetal monitoring - periods of Cat II tracing throughout the day but recently improved with repositioning. Continues to have excellent variability. Will consider decreasing pitocin or starting amnioinfusion if recurs.  -- GBS positive (PCN)  Chronic HTN: Asymptomatic. Normal to moderate range BP.  UPC 0.25, platelets, AST/ALT, Cr wnl.  -- continue to monitor closely  A1GDM: BG normal on admit. Per H&P, no need for further checks.   Lambert Mody. Juleen China, DO OB/GYN Fellow, Faculty Practice  8:56 PM

## 2019-01-31 NOTE — Anesthesia Procedure Notes (Signed)
Epidural Patient location during procedure: OB Start time: 01/31/2019 11:34 AM End time: 01/31/2019 11:40 AM  Staffing Anesthesiologist: Effie Berkshire, MD Performed: anesthesiologist   Preanesthetic Checklist Completed: patient identified, site marked, surgical consent, pre-op evaluation, timeout performed, IV checked, risks and benefits discussed and monitors and equipment checked  Epidural Patient position: sitting Prep: ChloraPrep Patient monitoring: heart rate, continuous pulse ox and blood pressure Approach: midline Location: L3-L4 Injection technique: LOR saline  Needle:  Needle type: Tuohy  Needle gauge: 17 G Needle length: 9 cm Catheter type: closed end flexible Catheter size: 20 Guage Test dose: negative and 1.5% lidocaine  Assessment Events: blood not aspirated, injection not painful, no injection resistance and no paresthesia  Additional Notes LOR @ 5.5  Patient identified. Risks/Benefits/Options discussed with patient including but not limited to bleeding, infection, nerve damage, paralysis, failed block, incomplete pain control, headache, blood pressure changes, nausea, vomiting, reactions to medications, itching and postpartum back pain. Confirmed with bedside nurse the patient's most recent platelet count. Confirmed with patient that they are not currently taking any anticoagulation, have any bleeding history or any family history of bleeding disorders. Patient expressed understanding and wished to proceed. All questions were answered. Sterile technique was used throughout the entire procedure. Please see nursing notes for vital signs. Test dose was given through epidural catheter and negative prior to continuing to dose epidural or start infusion. Warning signs of high block given to the patient including shortness of breath, tingling/numbness in hands, complete motor block, or any concerning symptoms with instructions to call for help. Patient was given instructions  on fall risk and not to get out of bed. All questions and concerns addressed with instructions to call with any issues or inadequate analgesia.    Reason for block:procedure for pain

## 2019-01-31 NOTE — Progress Notes (Signed)
Pt has had three prolonged late variables associated with activity OOB.  Provider made aware.  Pt also requesting pain medication at this time.  Provider approved administration of painmed

## 2019-02-01 ENCOUNTER — Encounter (HOSPITAL_COMMUNITY): Payer: Self-pay | Admitting: *Deleted

## 2019-02-01 ENCOUNTER — Inpatient Hospital Stay (HOSPITAL_COMMUNITY): Payer: Medicaid Other | Admitting: Anesthesiology

## 2019-02-01 ENCOUNTER — Encounter (HOSPITAL_COMMUNITY)
Admission: AD | Disposition: A | Discharge status: Home / Self Care | Payer: Self-pay | Source: Home / Self Care | Attending: Obstetrics and Gynecology

## 2019-02-01 DIAGNOSIS — O24420 Gestational diabetes mellitus in childbirth, diet controlled: Secondary | ICD-10-CM

## 2019-02-01 DIAGNOSIS — O99824 Streptococcus B carrier state complicating childbirth: Secondary | ICD-10-CM

## 2019-02-01 DIAGNOSIS — O1092 Unspecified pre-existing hypertension complicating childbirth: Secondary | ICD-10-CM

## 2019-02-01 LAB — CBC
HCT: 29 % — ABNORMAL LOW (ref 36.0–46.0)
Hemoglobin: 9.5 g/dL — ABNORMAL LOW (ref 12.0–15.0)
MCH: 26.2 pg (ref 26.0–34.0)
MCHC: 32.8 g/dL (ref 30.0–36.0)
MCV: 79.9 fL — ABNORMAL LOW (ref 80.0–100.0)
Platelets: 163 10*3/uL (ref 150–400)
RBC: 3.63 MIL/uL — ABNORMAL LOW (ref 3.87–5.11)
RDW: 16.3 % — ABNORMAL HIGH (ref 11.5–15.5)
WBC: 17.8 10*3/uL — ABNORMAL HIGH (ref 4.0–10.5)
nRBC: 0 % (ref 0.0–0.2)

## 2019-02-01 LAB — GLUCOSE, CAPILLARY: Glucose-Capillary: 116 mg/dL — ABNORMAL HIGH (ref 70–99)

## 2019-02-01 SURGERY — Surgical Case
Anesthesia: Epidural

## 2019-02-01 MED ORDER — MORPHINE SULFATE (PF) 0.5 MG/ML IJ SOLN
INTRAMUSCULAR | Status: AC
  Filled 2019-02-01: qty 10

## 2019-02-01 MED ORDER — SIMETHICONE 80 MG PO CHEW
80.0000 mg | CHEWABLE_TABLET | Freq: Three times a day (TID) | ORAL | Status: DC
  Administered 2019-02-01 – 2019-02-06 (×15): 80 mg via ORAL
  Filled 2019-02-01 (×15): qty 1

## 2019-02-01 MED ORDER — FENTANYL CITRATE (PF) 100 MCG/2ML IJ SOLN
INTRAMUSCULAR | Status: AC
  Filled 2019-02-01: qty 2

## 2019-02-01 MED ORDER — DIPHENHYDRAMINE HCL 25 MG PO CAPS: 25.0000 mg | ORAL_CAPSULE | ORAL | Status: DC | PRN

## 2019-02-01 MED ORDER — SCOPOLAMINE 1 MG/3DAYS TD PT72: 1.0000 | MEDICATED_PATCH | Freq: Once | TRANSDERMAL | Status: DC

## 2019-02-01 MED ORDER — IBUPROFEN 800 MG PO TABS
800.0000 mg | ORAL_TABLET | Freq: Four times a day (QID) | ORAL | Status: DC
  Administered 2019-02-02 – 2019-02-06 (×18): 800 mg via ORAL
  Filled 2019-02-01 (×18): qty 1

## 2019-02-01 MED ORDER — KETOROLAC TROMETHAMINE 30 MG/ML IJ SOLN: 30.0000 mg | Freq: Four times a day (QID) | INTRAMUSCULAR | Status: AC | PRN

## 2019-02-01 MED ORDER — ONDANSETRON HCL 4 MG/2ML IJ SOLN
INTRAMUSCULAR | Status: AC
  Filled 2019-02-01: qty 2

## 2019-02-01 MED ORDER — LACTATED RINGERS IV SOLN
INTRAVENOUS | Status: DC
  Administered 2019-02-01 – 2019-02-04 (×2): via INTRAVENOUS

## 2019-02-01 MED ORDER — ZOLPIDEM TARTRATE 5 MG PO TABS: 5.0000 mg | ORAL_TABLET | Freq: Every evening | ORAL | Status: DC | PRN

## 2019-02-01 MED ORDER — ACETAMINOPHEN 325 MG PO TABS
650.0000 mg | ORAL_TABLET | ORAL | Status: DC | PRN
  Administered 2019-02-02 – 2019-02-06 (×3): 650 mg via ORAL
  Filled 2019-02-01 (×3): qty 2

## 2019-02-01 MED ORDER — SODIUM CHLORIDE 0.9 % IV SOLN
500.0000 mg | Freq: Once | INTRAVENOUS | Status: AC
  Administered 2019-02-01: 500 mg via INTRAVENOUS

## 2019-02-01 MED ORDER — SODIUM CHLORIDE 0.9% FLUSH: 3.0000 mL | INTRAVENOUS | Status: DC | PRN

## 2019-02-01 MED ORDER — SOD CITRATE-CITRIC ACID 500-334 MG/5ML PO SOLN: 30.0000 mL | ORAL | Status: DC

## 2019-02-01 MED ORDER — SODIUM CHLORIDE 0.9 % IV SOLN
INTRAVENOUS | Status: DC | PRN
  Administered 2019-02-01: 40 [IU] via INTRAVENOUS

## 2019-02-01 MED ORDER — CEFAZOLIN SODIUM-DEXTROSE 2-4 GM/100ML-% IV SOLN
2.0000 g | INTRAVENOUS | Status: AC
  Administered 2019-02-01: 2 g via INTRAVENOUS

## 2019-02-01 MED ORDER — PRENATAL MULTIVITAMIN CH
1.0000 | ORAL_TABLET | Freq: Every day | ORAL | Status: DC
  Administered 2019-02-01 – 2019-02-06 (×6): 1 via ORAL
  Filled 2019-02-01 (×6): qty 1

## 2019-02-01 MED ORDER — MEPERIDINE HCL 25 MG/ML IJ SOLN
INTRAMUSCULAR | Status: DC | PRN
  Administered 2019-02-01 (×2): 12.5 mg via INTRAVENOUS

## 2019-02-01 MED ORDER — SCOPOLAMINE 1 MG/3DAYS TD PT72
MEDICATED_PATCH | TRANSDERMAL | Status: AC
  Filled 2019-02-01: qty 1

## 2019-02-01 MED ORDER — COCONUT OIL OIL: 1.0000 "application " | TOPICAL_OIL | Status: DC | PRN

## 2019-02-01 MED ORDER — ACETAMINOPHEN 10 MG/ML IV SOLN
INTRAVENOUS | Status: AC
  Filled 2019-02-01: qty 100

## 2019-02-01 MED ORDER — MEPERIDINE HCL 25 MG/ML IJ SOLN: 6.2500 mg | INTRAMUSCULAR | Status: DC | PRN

## 2019-02-01 MED ORDER — OXYTOCIN 40 UNITS IN NORMAL SALINE INFUSION - SIMPLE MED
INTRAVENOUS | Status: AC
  Filled 2019-02-01: qty 1000

## 2019-02-01 MED ORDER — OXYTOCIN 40 UNITS IN NORMAL SALINE INFUSION - SIMPLE MED: 2.5000 [IU]/h | INTRAVENOUS | Status: AC

## 2019-02-01 MED ORDER — SENNOSIDES-DOCUSATE SODIUM 8.6-50 MG PO TABS
2.0000 | ORAL_TABLET | ORAL | Status: DC
  Administered 2019-02-01 – 2019-02-05 (×5): 2 via ORAL
  Filled 2019-02-01 (×5): qty 2

## 2019-02-01 MED ORDER — SCOPOLAMINE 1 MG/3DAYS TD PT72
MEDICATED_PATCH | TRANSDERMAL | Status: DC | PRN
  Administered 2019-02-01: 1 via TRANSDERMAL

## 2019-02-01 MED ORDER — SIMETHICONE 80 MG PO CHEW: 80.0000 mg | CHEWABLE_TABLET | ORAL | Status: DC | PRN

## 2019-02-01 MED ORDER — MEPERIDINE HCL 25 MG/ML IJ SOLN
INTRAMUSCULAR | Status: AC
  Filled 2019-02-01: qty 1

## 2019-02-01 MED ORDER — SODIUM BICARBONATE 8.4 % IV SOLN
INTRAVENOUS | Status: DC | PRN
  Administered 2019-02-01 (×2): 5 mL via EPIDURAL

## 2019-02-01 MED ORDER — NALBUPHINE HCL 10 MG/ML IJ SOLN: 5.0000 mg | INTRAMUSCULAR | Status: DC | PRN

## 2019-02-01 MED ORDER — KETOROLAC TROMETHAMINE 30 MG/ML IJ SOLN
30.0000 mg | Freq: Four times a day (QID) | INTRAMUSCULAR | Status: AC
  Administered 2019-02-01 (×3): 30 mg via INTRAVENOUS
  Filled 2019-02-01 (×3): qty 1

## 2019-02-01 MED ORDER — DIBUCAINE (PERIANAL) 1 % EX OINT: 1.0000 "application " | TOPICAL_OINTMENT | CUTANEOUS | Status: DC | PRN

## 2019-02-01 MED ORDER — NALBUPHINE HCL 10 MG/ML IJ SOLN: 5.0000 mg | Freq: Once | INTRAMUSCULAR | Status: DC | PRN

## 2019-02-01 MED ORDER — WITCH HAZEL-GLYCERIN EX PADS: 1.0000 "application " | MEDICATED_PAD | CUTANEOUS | Status: DC | PRN

## 2019-02-01 MED ORDER — FENTANYL CITRATE (PF) 100 MCG/2ML IJ SOLN
25.0000 ug | INTRAMUSCULAR | Status: DC | PRN
  Administered 2019-02-01 (×2): 50 ug via INTRAVENOUS
  Administered 2019-02-01: 25 ug via INTRAVENOUS

## 2019-02-01 MED ORDER — NALOXONE HCL 4 MG/10ML IJ SOLN
1.0000 ug/kg/h | INTRAVENOUS | Status: DC | PRN
  Filled 2019-02-01: qty 5

## 2019-02-01 MED ORDER — PROMETHAZINE HCL 25 MG/ML IJ SOLN: 6.2500 mg | INTRAMUSCULAR | Status: DC | PRN

## 2019-02-01 MED ORDER — PHENYLEPHRINE HCL (PRESSORS) 10 MG/ML IV SOLN
INTRAVENOUS | Status: DC | PRN
  Administered 2019-02-01: 40 ug via INTRAVENOUS
  Administered 2019-02-01 (×2): 80 ug via INTRAVENOUS
  Administered 2019-02-01: 40 ug via INTRAVENOUS
  Administered 2019-02-01 (×2): 80 ug via INTRAVENOUS

## 2019-02-01 MED ORDER — PHENYLEPHRINE 40 MCG/ML (10ML) SYRINGE FOR IV PUSH (FOR BLOOD PRESSURE SUPPORT)
PREFILLED_SYRINGE | INTRAVENOUS | Status: AC
  Filled 2019-02-01: qty 10

## 2019-02-01 MED ORDER — ENOXAPARIN SODIUM 40 MG/0.4ML ~~LOC~~ SOLN
40.0000 mg | SUBCUTANEOUS | Status: DC
  Administered 2019-02-01 – 2019-02-05 (×4): 40 mg via SUBCUTANEOUS
  Filled 2019-02-01 (×4): qty 0.4

## 2019-02-01 MED ORDER — KETOROLAC TROMETHAMINE 30 MG/ML IJ SOLN
INTRAMUSCULAR | Status: AC
  Filled 2019-02-01: qty 1

## 2019-02-01 MED ORDER — SIMETHICONE 80 MG PO CHEW
80.0000 mg | CHEWABLE_TABLET | ORAL | Status: DC
  Administered 2019-02-01 – 2019-02-05 (×5): 80 mg via ORAL
  Filled 2019-02-01 (×5): qty 1

## 2019-02-01 MED ORDER — SODIUM CHLORIDE 0.9 % IR SOLN
Status: DC | PRN
  Administered 2019-02-01: 1000 mL

## 2019-02-01 MED ORDER — DIPHENHYDRAMINE HCL 25 MG PO CAPS: 25.0000 mg | ORAL_CAPSULE | Freq: Four times a day (QID) | ORAL | Status: DC | PRN

## 2019-02-01 MED ORDER — ACETAMINOPHEN 10 MG/ML IV SOLN
1000.0000 mg | Freq: Once | INTRAVENOUS | Status: DC | PRN
  Administered 2019-02-01: 1000 mg via INTRAVENOUS

## 2019-02-01 MED ORDER — LACTATED RINGERS IV SOLN: INTRAVENOUS | Status: DC

## 2019-02-01 MED ORDER — TETANUS-DIPHTH-ACELL PERTUSSIS 5-2.5-18.5 LF-MCG/0.5 IM SUSP: 0.5000 mL | Freq: Once | INTRAMUSCULAR | Status: DC

## 2019-02-01 MED ORDER — SODIUM CHLORIDE 0.9 % IV SOLN
INTRAVENOUS | Status: DC | PRN
  Administered 2019-02-01: 01:00:00 via INTRAVENOUS

## 2019-02-01 MED ORDER — MORPHINE SULFATE (PF) 0.5 MG/ML IJ SOLN
INTRAMUSCULAR | Status: DC | PRN
  Administered 2019-02-01: 3 mg via EPIDURAL

## 2019-02-01 MED ORDER — SODIUM CHLORIDE 0.9 % IV SOLN
INTRAVENOUS | Status: AC
  Filled 2019-02-01: qty 500

## 2019-02-01 MED ORDER — OXYCODONE HCL 5 MG PO TABS
5.0000 mg | ORAL_TABLET | ORAL | Status: DC | PRN
  Administered 2019-02-02 – 2019-02-03 (×6): 5 mg via ORAL
  Administered 2019-02-04 (×2): 10 mg via ORAL
  Administered 2019-02-05 – 2019-02-06 (×2): 5 mg via ORAL
  Filled 2019-02-01 (×2): qty 1
  Filled 2019-02-01: qty 2
  Filled 2019-02-01 (×4): qty 1
  Filled 2019-02-01: qty 2
  Filled 2019-02-01 (×2): qty 1

## 2019-02-01 MED ORDER — METOCLOPRAMIDE HCL 5 MG/ML IJ SOLN
INTRAMUSCULAR | Status: AC
  Filled 2019-02-01: qty 2

## 2019-02-01 MED ORDER — KETOROLAC TROMETHAMINE 30 MG/ML IJ SOLN
30.0000 mg | Freq: Four times a day (QID) | INTRAMUSCULAR | Status: AC | PRN
  Administered 2019-02-01: 30 mg via INTRAMUSCULAR

## 2019-02-01 MED ORDER — ACETAMINOPHEN 325 MG PO TABS: 325.0000 mg | ORAL_TABLET | Freq: Once | ORAL | Status: DC | PRN

## 2019-02-01 MED ORDER — ONDANSETRON HCL 4 MG/2ML IJ SOLN
INTRAMUSCULAR | Status: DC | PRN
  Administered 2019-02-01: 4 mg via INTRAVENOUS

## 2019-02-01 MED ORDER — MENTHOL 3 MG MT LOZG: 1.0000 | LOZENGE | OROMUCOSAL | Status: DC | PRN

## 2019-02-01 MED ORDER — DIPHENHYDRAMINE HCL 50 MG/ML IJ SOLN: 12.5000 mg | INTRAMUSCULAR | Status: DC | PRN

## 2019-02-01 MED ORDER — ACETAMINOPHEN 160 MG/5ML PO SOLN: 325.0000 mg | Freq: Once | ORAL | Status: DC | PRN

## 2019-02-01 MED ORDER — NALOXONE HCL 0.4 MG/ML IJ SOLN: 0.4000 mg | INTRAMUSCULAR | Status: DC | PRN

## 2019-02-01 MED ORDER — ONDANSETRON HCL 4 MG/2ML IJ SOLN: 4.0000 mg | Freq: Three times a day (TID) | INTRAMUSCULAR | Status: DC | PRN

## 2019-02-01 SURGICAL SUPPLY — 41 items
BENZOIN TINCTURE PRP APPL 2/3 (GAUZE/BANDAGES/DRESSINGS) ×3 IMPLANT
CHLORAPREP W/TINT 26ML (MISCELLANEOUS) ×3 IMPLANT
CLAMP CORD UMBIL (MISCELLANEOUS) IMPLANT
CLOSURE STERI-STRIP 1/2X4 (GAUZE/BANDAGES/DRESSINGS) ×1
CLOSURE WOUND 1/2 X4 (GAUZE/BANDAGES/DRESSINGS) ×1
CLOTH BEACON ORANGE TIMEOUT ST (SAFETY) ×3 IMPLANT
CLSR STERI-STRIP ANTIMIC 1/2X4 (GAUZE/BANDAGES/DRESSINGS) ×2 IMPLANT
DRAPE C SECTION CLR SCREEN (DRAPES) IMPLANT
DRSG OPSITE POSTOP 4X10 (GAUZE/BANDAGES/DRESSINGS) ×3 IMPLANT
ELECT REM PT RETURN 9FT ADLT (ELECTROSURGICAL) ×3
ELECTRODE REM PT RTRN 9FT ADLT (ELECTROSURGICAL) ×1 IMPLANT
EXTRACTOR VACUUM M CUP 4 TUBE (SUCTIONS) IMPLANT
EXTRACTOR VACUUM M CUP 4' TUBE (SUCTIONS)
GLOVE BIO SURGEON STRL SZ7.5 (GLOVE) ×3 IMPLANT
GLOVE BIOGEL PI IND STRL 7.0 (GLOVE) ×1 IMPLANT
GLOVE BIOGEL PI INDICATOR 7.0 (GLOVE) ×2
GOWN STRL REUS W/TWL 2XL LVL3 (GOWN DISPOSABLE) ×3 IMPLANT
GOWN STRL REUS W/TWL LRG LVL3 (GOWN DISPOSABLE) ×6 IMPLANT
KIT ABG SYR 3ML LUER SLIP (SYRINGE) IMPLANT
NEEDLE HYPO 22GX1.5 SAFETY (NEEDLE) ×3 IMPLANT
NEEDLE HYPO 25X5/8 SAFETYGLIDE (NEEDLE) IMPLANT
NS IRRIG 1000ML POUR BTL (IV SOLUTION) ×3 IMPLANT
PACK C SECTION WH (CUSTOM PROCEDURE TRAY) ×3 IMPLANT
PAD OB MATERNITY 4.3X12.25 (PERSONAL CARE ITEMS) ×3 IMPLANT
PENCIL SMOKE EVAC W/HOLSTER (ELECTROSURGICAL) ×3 IMPLANT
RTRCTR C-SECT PINK 25CM LRG (MISCELLANEOUS) ×3 IMPLANT
STRIP CLOSURE SKIN 1/2X4 (GAUZE/BANDAGES/DRESSINGS) ×2 IMPLANT
SUT CHROMIC 1 CTX 36 (SUTURE) ×6 IMPLANT
SUT VIC AB 1 CT1 36 (SUTURE) ×6 IMPLANT
SUT VIC AB 2-0 CT1 (SUTURE) ×3 IMPLANT
SUT VIC AB 2-0 CT1 27 (SUTURE) ×2
SUT VIC AB 2-0 CT1 TAPERPNT 27 (SUTURE) ×1 IMPLANT
SUT VIC AB 3-0 CT1 27 (SUTURE) ×4
SUT VIC AB 3-0 CT1 TAPERPNT 27 (SUTURE) ×2 IMPLANT
SUT VIC AB 3-0 SH 27 (SUTURE)
SUT VIC AB 3-0 SH 27X BRD (SUTURE) IMPLANT
SUT VIC AB 4-0 KS 27 (SUTURE) ×3 IMPLANT
SYR BULB IRRIGATION 50ML (SYRINGE) IMPLANT
TOWEL OR 17X24 6PK STRL BLUE (TOWEL DISPOSABLE) ×3 IMPLANT
TRAY FOLEY W/BAG SLVR 14FR LF (SET/KITS/TRAYS/PACK) ×3 IMPLANT
WATER STERILE IRR 1000ML POUR (IV SOLUTION) ×3 IMPLANT

## 2019-02-01 NOTE — Lactation Note (Signed)
This note was copied from a baby's chart. Lactation Consultation Note  Patient Name: Mariah Livingston GYLUD'A Date: 02/01/2019   Spoke to [place] and she let [facility] know that mom is just feeding bottles and she's not interested in BF at this point. Only one LATCH score of 5 noted on chart earlier this morning, but everything else has been just bottle feeding. Asked RN to notify lactation in case mom changes her mind and decides to BF.   Maternal Data    Feeding    LATCH Score                   Interventions    Lactation Tools Discussed/Used     Consult Status      Corinthian Kemler Francene Boyers 02/01/2019, 9:08 PM

## 2019-02-01 NOTE — Transfer of Care (Signed)
Immediate Anesthesia Transfer of Care Note  Patient: [name]  Procedure(s) Performed: CESAREAN SECTION (N/A )  Patient Location: PACU  Anesthesia Type:Epidural  Level of Consciousness: awake, alert  and oriented  Airway & Oxygen Therapy: Patient Spontanous Breathing  Post-op Assessment: Report given to RN and Post -op Vital signs reviewed and stable  Post vital signs: Reviewed and stable  Last Vitals:  Vitals Value Taken Time  BP 122/90 02/01/2019  2:00 AM  Temp 36.5 C 02/01/2019  2:05 AM  Pulse 90 02/01/2019  2:14 AM  Resp 25 02/01/2019  2:14 AM  SpO2 100 % 02/01/2019  2:14 AM  Vitals shown include unvalidated device data.  Last Pain:  Vitals:   02/01/19 0205  TempSrc:   PainSc: 10-Worst pain ever      Patients Stated Pain Goal: 9 (67/70/34 0352)  Complications: No apparent anesthesia complications

## 2019-02-01 NOTE — Op Note (Signed)
Mariah Livingston PROCEDURE DATE: 02/01/2019  PREOPERATIVE DIAGNOSES: Intrauterine pregnancy at [redacted]w[redacted]d weeks gestation; failure to progress, arrest of dilation  POSTOPERATIVE DIAGNOSES: The same  PROCEDURE: Primary Low Transverse Cesarean Section  SURGEON:  Dr. Arlina Robes ASSISTANT:  Dr. Gerri Lins   ANESTHESIOLOGY TEAM: Anesthesiologist: Effie Berkshire, MD CRNA: Genevie Ann, CRNA  INDICATIONS: Mariah Livingston is a 38 y.o. G5P0040 at [redacted]w[redacted]d here for cesarean section secondary to the indications listed under preoperative diagnoses; please see preoperative note for further details.  The risks of cesarean section were discussed with the patient including but were not limited to: bleeding which may require transfusion or reoperation; infection which may require antibiotics; injury to bowel, bladder, ureters or other surrounding organs; injury to the fetus; need for additional procedures including hysterectomy in the event of a life-threatening hemorrhage; placental abnormalities wth subsequent pregnancies, incisional problems, thromboembolic phenomenon and other postoperative/anesthesia complications.   The patient concurred with the proposed plan, giving informed written consent for the procedure.    FINDINGS:  Viable female infant in cephalic presentation.  Apgars 2 and 9.  Clear amniotic fluid.  Intact placenta, three vessel cord.  Normal uterus aside from anterior fibroid (removed) and LUS fibroid (integrated into hysterotomy repair). Normal fallopian tubes and ovaries bilaterally.  ANESTHESIA: Epidural  INTRAVENOUS FLUIDS: 2500 ml   ESTIMATED BLOOD LOSS: 577 ml URINE OUTPUT:  300 ml SPECIMENS: Placenta sent to L&D COMPLICATIONS: None immediate  PROCEDURE IN DETAIL:  The patient preoperatively received intravenous antibiotics and had sequential compression devices applied to her lower extremities.  She was then taken to the operating room where the epidural anesthesia was dosed up to  surgical level and was found to be adequate. She was then placed in a dorsal supine position with a leftward tilt, and prepped and draped in a sterile manner.  A foley catheter had previously been placed into her bladder and attached to constant gravity.  After an adequate timeout was performed, a Pfannenstiel skin incision was made with scalpel and carried through to the underlying layer of fascia. The fascia was incised in the midline, and this incision was extended bilaterally using the Mayo scissors.  Kocher clamps were applied to the superior aspect of the fascial incision and the underlying rectus muscles were dissected off bluntly.  A similar process was carried out on the inferior aspect of the fascial incision. The rectus muscles were separated in the midline and the peritoneum was entered bluntly. The Alexis self-retaining retractor was introduced into the abdominal cavity.  Attention was turned to the lower uterine segment where a low transverse hysterotomy was made with a scalpel and extended bilaterally bluntly.  The infant was successfully delivered, the cord was clamped and cut after one minute, and the infant was handed over to the awaiting neonatology team. Uterine massage was then administered, and the placenta delivered intact with a three-vessel cord. The uterus was then cleared of clots and debris.  The hysterotomy was closed with 0 Chromic in a running locked fashion, and an imbricating layer was also placed with 0 Chromic.  Figure-of-eight 0 Chromic serosal stitches were placed to help with hemostasis.  The pelvis was cleared of all clot and debris. Hemostasis was confirmed on all surfaces.  The retractor was removed.  The peritoneum was closed with a 2-0 Vicryl running stitch. The fascia was then closed using 0 Vicryl in a running fashion.  The subcutaneous layer was irrigated, reapproximated with 2-0 plain gut interrupted stitches, and the skin was  closed with a 4-0 Vicryl subcuticular  stitch. The patient tolerated the procedure well. Sponge, instrument and needle counts were correct x 3.  She was taken to the recovery room in stable condition.   Lambert Mody. Juleen China, DO OB/GYN Fellow

## 2019-02-01 NOTE — Anesthesia Postprocedure Evaluation (Signed)
Anesthesia Post Note  Patient: [name]  Procedure(s) Performed: CESAREAN SECTION (N/A )     Patient location during evaluation: PACU Anesthesia Type: Epidural Level of consciousness: oriented and awake and alert Pain management: pain level controlled Vital Signs Assessment: post-procedure vital signs reviewed and stable Respiratory status: spontaneous breathing, respiratory function stable and patient connected to nasal cannula oxygen Cardiovascular status: blood pressure returned to baseline and stable Postop Assessment: no headache, no backache, no apparent nausea or vomiting and epidural receding Anesthetic complications: no    Last Vitals:  Vitals:   02/01/19 0504 02/01/19 0610  BP: (!) 89/57 94/76  Pulse: (!) 107 97  Resp: 18 18  Temp: 36.8 C 36.6 C  SpO2: 93% 94%    Last Pain:  Vitals:   02/01/19 0610  TempSrc: Oral  PainSc: 1    Pain Goal: Patients Stated Pain Goal: 9 (01/30/19 1709)              Epidural/Spinal Function Cutaneous sensation: Normal sensation (02/01/19 0610)  Effie Berkshire

## 2019-02-01 NOTE — Progress Notes (Signed)
Dr. Juleen China & Dr. Rip Harbour at bedside consenting patient for cesarean section.

## 2019-02-01 NOTE — Consult Note (Signed)
Neonatology Note:   Attendance at C-section:    I was asked by Dr. Juleen China to attend this primary C/S at term due to failure to progress. The mother is a G5P0A4 O pos, GBS pos with chronic HTN, obesity, GDM (diet-controlled), oligohydramnios, and repetitive FHR decelerations during labor (amnioinfusion). ROM 15 hours before delivery, fluid clear. She was treated with Pen G > 4 hours before delivery. Infant had good muscle tone at birth and appeared about to cry, but did not breathe despite bulb suctioning and stimulation by OB during delayed cord clamping. Baby's muscle tone began to decrease, so I asked them to clamp the cord at 45 seconds. The baby was floppy and slightly dusky, with a HR less than 100 at 1 minute. I bulb suctioned and got large amounts of thick white mucous from his nostrils and mouth, then applied PPV. HR came up quickly; I paused after about 40 seconds and bulb suctioned again, then resumed PPV as the baby remained apneic. By about 2 minutes, he began to cry, and maintained good HR, color, and regular breathing after that. Muscle tone normalized by 3 minutes. Ap 2/9. Lungs clear to ausc in DR. Infant is able to remain with his mother for skin to skin time under nursing supervision. Transferred to the care of Pediatrician.   Real Cons, MD

## 2019-02-01 NOTE — Progress Notes (Signed)
Patient ID: Mariah Livingston, female   DOB: Jul 17, 1981, 38 y.o.   MRN: [id number] The risks of cesarean section discussed with the patient included but were not limited to: bleeding which may require transfusion or reoperation; infection which may require antibiotics; injury to bowel, bladder, ureters or other surrounding organs; injury to the fetus; need for additional procedures including hysterectomy in the event of a life-threatening hemorrhage; placental abnormalities wth subsequent pregnancies, incisional problems, thromboembolic phenomenon and other postoperative/anesthesia complications. The patient concurred with the proposed plan, giving informed written consent for the procedure.   Anesthesia and OR aware. Preoperative prophylactic antibiotics and SCDs ordered on call to the OR.  To OR when ready.  Adriann Ballweg L. Rip Harbour, MD

## 2019-02-01 NOTE — Progress Notes (Signed)
OB/GYN Faculty Practice: Labor Progress Note  Subjective: Comfortable. Into room with patient to discuss C/S, Dr. Rip Harbour present.  Objective: BP 99/69   Pulse 80   Temp 99.7 F (37.6 C) (Oral)   Resp 18   Ht 5\' 6"  (1.676 m)   Wt 86.6 kg   LMP 04/30/2018 (Approximate)   SpO2 100%   BMI 30.83 kg/m  Gen: tired-appearing, NAD Dilation: 5 Effacement (%): 70 Cervical Position: Anterior Station: -2 Presentation: Vertex Exam by:: Jasmen Emrich MD  Assessment and Plan: 38 y.o. H6K0881 [redacted]w[redacted]d here for IOl for oligohydramnios, unstable lie.  Labor: See prior progress notes for details. Into room to discuss C/S with patient for arrest of dilation. Patient has had same exam since 0900, adequate MVUs for about 5 hours. Suspect asynclitism given history of unstable lie.   The risks of cesarean section discussed with the patient included but were not limited to: bleeding which may require transfusion or reoperation; infection which may require antibiotics; injury to bowel, bladder, ureters or other surrounding organs; injury to the fetus; need for additional procedures including hysterectomy in the event of a life-threatening hemorrhage; placental abnormalities wth subsequent pregnancies, incisional problems, thromboembolic phenomenon and other postoperative/anesthesia complications. The patient concurred with the proposed plan, giving informed written consent for the procedure.   Patient has been NPO since at least 1300, and she will remain NPO for procedure. Anesthesia and OR aware. Preoperative prophylactic antibiotics and SCDs ordered on call to the OR.  To OR when ready.  Fetal Well-Being: EFW 3018g (77%) at 36w1. Cephalic by sutures.  -- Category II - continuous fetal monitoring - recently persistent variable/late decelerations - will stop pitocin now that going back to OR -- GBS positive    Lora Chavers S. Juleen China, DO OB/GYN Fellow, Faculty Practice  12:30 AM

## 2019-02-01 NOTE — Discharge Summary (Addendum)
Obstetrics Discharge Summary OB/GYN Faculty Practice   Patient Name: Mariah Livingston DOB: 1981/05/19 MRN: [id number]  Date of admission: 01/30/2019 Delivering MD: Glenice Bow   Date of discharge: 02/06/2019  Admitting diagnosis: transfer lie 39 weeks oligo Intrauterine pregnancy: [redacted]w[redacted]d    Secondary diagnosis:   Principal Problem:   Severe Preeclampsia superimposed on chronic hypertension, postpartum Active Problems:   Uterine fibroids affecting pregnancy   Gestational diabetes   Late prenatal care affecting pregnancy   GBS (group B Streptococcus carrier), +RV culture, currently pregnant   Malpresentation of fetus   Oligohydramnios   S/P cesarean section    Discharge diagnosis: Term Pregnancy Delivered, CHTN, GDM A1 and Anemia                                            Postpartum procedures: blood transfusion- PRBC x 3u Complications:Superimposed postpartum severe preeclampsia  Outpatient Follow-Up: _0  BP check - ensure connection to PCP, previously on hydralazine and HCTZ prior to pregnancy _1  incision check _2  2-hr GTT   Hospital course: Mariah Rockwoodis a 38y.o. 340w4dho was admitted for induction of labor for oligohydramnios and unstable lie. Her pregnancy was complicated by above noted. Her labor course was notable for induction with cytotec, FB, pitocin and epidural placement. Patient went back for C/S for arrest of dilation. Delivery was complicated by fibroidectomy. Please see delivery/op note for additional details. Her HgB trended from 10.9 to 5.8 on POD#1. She was agreeable to a blood transfusion after hearing R&Bs. By POD#2 her Hgb was 9.5 post transfusion. Her postpartum course was also remarkable for being started on Vasotec and amlodipine for BP control. She was breastfeeding without difficulty. On POD#3, she had severe range BP and her Cr trended up to 1.1, she was started on magnesium sulfate for eclampsia prophylaxis and her HTN regimen was increased to Vasotec  10 mg bid and Amlodipine 10 mg daily.  She remained stable, magnesium sulfate was discontinued by  24 hours after initiation.  Her BP remained stable.  By day of discharge on POD#5, she was passing flatus, urinating, eating and drinking without difficulty. Her pain was well-controlled, and she was discharged home with ibuprofen and oxycodone. She will follow-up in clinic in next week for an incision and BP check and in 4-6 weeks for a postpartum visit. She has a home BP cuff and was counseled on signs/symptoms of preeclampsia prior to discharge.   Physical exam  Vitals:   02/05/19 2341 02/06/19 0434 02/06/19 0435 02/06/19 0743  BP: (!) 150/88 (!) 148/93  139/80  Pulse: 76 81  75  Resp: _3 Temp: 98 F (36.7 C) 98.4 F (36.9 C)  98.5 F (36.9 C)  TempSrc: Oral Oral  Oral  SpO2: 100% 100% 98% 100%  Weight:      Height:       General: well-appearing, NAD Lochia: appropriate Uterine Fundus: firm Incision: Dressing is clean, dry, and intact DVT Evaluation: Trace BLE edema   Labs: CBC Latest Ref Rng & Units 02/06/2019 02/05/2019 02/04/2019  WBC 4.0 - 10.5 K/uL 8.4 10.2 10.6(H)  Hemoglobin 12.0 - 15.0 g/dL 11.8(L) 11.6(L) 11.7(L)  Hematocrit 36.0 - 46.0 % 34.8(L) 33.5(L) 33.9(L)  Platelets 150 - 400 K/uL 268 244 238   CMP Latest Ref Rng & Units 02/06/2019 02/05/2019 02/04/2019  Glucose 70 -  99 mg/dL 93 118(H) 102(H)  BUN 6 - 20 mg/dL _0 Creatinine 0.44 - 1.00 mg/dL 0.90 1.06(H) 0.79  Sodium 135 - 145 mmol/L 136 138 138  Potassium 3.5 - 5.1 mmol/L 3.8 3.5 3.8  Chloride 98 - 111 mmol/L 100 101 104  CO2 22 - 32 mmol/L _1 Calcium 8.9 - 10.3 mg/dL 8.0(L) 8.2(L) 9.0  Total Protein 6.5 - 8.1 g/dL 6.4(L) 6.1(L) 6.5  Total Bilirubin 0.3 - 1.2 mg/dL 0.5 0.8 0.4  Alkaline Phos 38 - 126 U/L 113 122 133(H)  AST 15 - 41 U/L 25 36 42(H)  ALT 0 - 44 U/L 43 57(H) 62(H)      Discharge instructions: Per After Visit Summary and "Baby and Me Booklet"  After visit meds:   Allergies as of 02/06/2019   No Known Allergies     Medication List    STOP taking these medications   Accu-Chek FastClix Lancets Misc   Accu-Chek Guide Me w/Device Kit   glucose blood test strip   HYDRALAZINE-HCTZ PO   IRON PO   OVER THE COUNTER MEDICATION   ranitidine 150 MG capsule Commonly known as: ZANTAC   TYLENOL PO     TAKE these medications   amLODipine 10 MG tablet Commonly known as: NORVASC Take 1 tablet (10 mg total) by mouth daily. Start taking on: February 07, 2019   aspirin EC 81 MG tablet Take 1 tablet (81 mg total) by mouth daily.   enalapril 10 MG tablet Commonly known as: VASOTEC Take 1 tablet (10 mg total) by mouth 2 (two) times daily. Notes to patient: Take next dose 02/06/19 at 9pm   Ferrous Fumarate 324 (106 Fe) MG Tabs tablet Commonly known as: HEMOCYTE - 106 mg FE Take 1 tablet (106 mg of iron total) by mouth 2 (two) times daily. Notes to patient: Take next dose 02/06/19 at 9pm   ibuprofen 600 MG tablet Commonly known as: ADVIL Take 1 tablet (600 mg total) by mouth every 6 (six) hours as needed for headache, mild pain, moderate pain or cramping. Notes to patient: Take next dose 02/06/19 at 6pm if needed   oxyCODONE 5 MG immediate release tablet Commonly known as: Oxy IR/ROXICODONE Take 1 tablet (5 mg total) by mouth every 4 (four) hours as needed for moderate pain. Notes to patient: Can take dose at 12pm if needed   Prenatal Complete 14-0.4 MG Tabs Take 1 tablet by mouth daily.   senna-docusate 8.6-50 MG tablet Commonly known as: Senokot-S Take 2 tablets by mouth 2 (two) times daily as needed for mild constipation or moderate constipation. Notes to patient: Can take a dose when needed            Discharge Care Instructions  (From admission, onward)         Start     Ordered   02/06/19 0000  Discharge wound care:    Comments: As per discharge handout and nursing instructions   02/06/19 1056          Postpartum  contraception: home with mother Diet: Routine Diet Activity: Advance as tolerated. Pelvic rest for 6 weeks.   Follow-up Appt: Future Appointments  Date Time Provider [place]  02/11/2019 10:30 AM [place] None  02/17/2019  1:45 PM Constant, Vickii Chafe, MD [place] None  03/10/2019  9:30 AM Wood[place] Mode, MD [facility] None   Follow-up Visit:No follow-ups on file.  Please schedule this patient for Postpartum visit in: 4 weeks  with the following provider: Any provider For C/S patients schedule nurse incision check in weeks 2 weeks: yes High risk pregnancy complicated by: cHTN, S1XBL, AMA Delivery mode:  CS Anticipated Birth Control:  other/unsure PP Procedures needed: incision check, BP check, 2-hr GTT  Schedule Integrated BH visit: no  Newborn Data: Live born female  Birth Weight:  3459g APGAR: 2, 9  Newborn Delivery   Birth date/time:  02/01/2019 01:09:00 Delivery type:  C-Section, Low Transverse Trial of labor:  No C-section categorization:  Primary    Baby Feeding: Breast Disposition:home with mother

## 2019-02-02 ENCOUNTER — Encounter: Payer: Medicaid Other | Admitting: Obstetrics and Gynecology

## 2019-02-02 LAB — HEMOGLOBIN AND HEMATOCRIT, BLOOD
HCT: 27.6 % — ABNORMAL LOW (ref 36.0–46.0)
Hemoglobin: 9.5 g/dL — ABNORMAL LOW (ref 12.0–15.0)

## 2019-02-02 LAB — CBC
HCT: 17.4 % — ABNORMAL LOW (ref 36.0–46.0)
Hemoglobin: 5.8 g/dL — CL (ref 12.0–15.0)
MCH: 26.6 pg (ref 26.0–34.0)
MCHC: 33.3 g/dL (ref 30.0–36.0)
MCV: 79.8 fL — ABNORMAL LOW (ref 80.0–100.0)
Platelets: 125 10*3/uL — ABNORMAL LOW (ref 150–400)
RBC: 2.18 MIL/uL — ABNORMAL LOW (ref 3.87–5.11)
RDW: 16.3 % — ABNORMAL HIGH (ref 11.5–15.5)
WBC: 13 10*3/uL — ABNORMAL HIGH (ref 4.0–10.5)
nRBC: 0 % (ref 0.0–0.2)

## 2019-02-02 LAB — PREPARE RBC (CROSSMATCH)

## 2019-02-02 MED ORDER — ACETAMINOPHEN 325 MG PO TABS
650.0000 mg | ORAL_TABLET | Freq: Once | ORAL | Status: AC
  Administered 2019-02-02: 650 mg via ORAL
  Filled 2019-02-02: qty 2

## 2019-02-02 MED ORDER — DIPHENHYDRAMINE HCL 25 MG PO CAPS
25.0000 mg | ORAL_CAPSULE | Freq: Once | ORAL | Status: AC
  Administered 2019-02-02: 25 mg via ORAL
  Filled 2019-02-02: qty 1

## 2019-02-02 MED ORDER — ENALAPRIL MALEATE 5 MG PO TABS
5.0000 mg | ORAL_TABLET | Freq: Every day | ORAL | Status: DC
  Administered 2019-02-02 – 2019-02-03 (×2): 5 mg via ORAL
  Filled 2019-02-02 (×2): qty 1

## 2019-02-02 MED ORDER — SODIUM CHLORIDE 0.9% IV SOLUTION
Freq: Once | INTRAVENOUS | Status: AC
  Administered 2019-02-02: 09:00:00 via INTRAVENOUS

## 2019-02-02 MED ORDER — FERROUS FUMARATE 324 (106 FE) MG PO TABS
1.0000 | ORAL_TABLET | Freq: Two times a day (BID) | ORAL | Status: DC
  Administered 2019-02-02 – 2019-02-06 (×8): 106 mg via ORAL
  Filled 2019-02-02 (×9): qty 1

## 2019-02-02 NOTE — Clinical Social Work Maternal (Signed)
CLINICAL SOCIAL WORK MATERNAL/CHILD NOTE  Patient Details  Name: Mariah Livingston MRN: [id number] Date of Birth: 23-Jan-1981  Date:  02/02/2019  Clinical Social Worker Initiating Note:  Durward Fortes, LCSWA  Date/Time: Initiated:  02/02/19/1045     Child's Name:  Magdalen Spatz    Biological Parents:  Mother, Father(Karesha Tuckahoe *MOB), Dory Larsen (FOB) )   Need for Interpreter:  Arabic   Reason for Referral:  Late or No Prenatal Care    Address:  [address] [place] [address]    Phone number:  [phone] (home)     Additional phone number: none   Household Members/Support Persons (HM/SP):   Household Member/Support Person 1   HM/SP Name Relationship DOB or Age  HM/SP -1 Mariah Livingston (MOB)   MOB   [birth year]  HM/SP -2        HM/SP -3        HM/SP -4        HM/SP -5        HM/SP -6        HM/SP -7        HM/SP -8          Natural Supports (not living in the home):  (none )   Professional Supports: None   Employment: Student(at [employer])   Type of Work: none    Education:  Attending college   Homebound arranged:  n/a  Museum/gallery curator Resources:  Medicaid   Other Resources:  [employer], Food Stamps    Cultural/Religious Considerations Which May Impact Care: none reported.  Strengths:  Ability to meet basic needs , Compliance with medical plan , Home prepared for child , Pediatrician chosen   Psychotropic Medications:       None   Pediatrician:    [place] area  Pediatrician List:   [place] [facility]. )  [place]      Pediatrician Fax Number:    Risk Factors/Current Problems:  Other (Comment)(limited supports. )   Cognitive State:  Alert , Able to Concentrate    Mood/Affect:  Flat , Comfortable    CSW Assessment: CSW consulted as MOB received [facility] with infant. CSW CSW went to speak with MOB at bedside to address further concerns and needs.    Upon entering MOB's room CSW observed that MOB was lying in bed while infant was in basinet. CSW spoke with MOB via interpretor services. Sheppard Evens #[id number] assisted CSW in speaking with MOB for assessment. CSW [employer] on the birth of infant. CSW advised MOB of CSW's role here in the hospital as well as advised MOB of the reason for the visit. Mob expressed that she did not get [facility] with infant due to no insurance. MOB expressed that she took Prenatal Vitamins earlier on in her pregnancy, however wasn't able to go to the Health Department until she got Medicaid at 6 1/2 months. MOB expressed that once she got insurance she went to the doctor. CSW understanding of this and advised MOB of the hospital drug screen policy. CSW advised MOB that infant was drug screened and the results of the UDS are negative. CSW advised MO that infants CDS is still pending and would take a few days to come back. CSW advised MOB that if infants CDS is positive for any substances that was not prescribed to her then a CPS report would need to  be made. MOB understanding of this.    CSW notified by MOB that FOB is in [place] and will not be involved with infant. MOB expressed that she lives alone but has her sister that lives in [place]. MOB expressed that she is her only support. MOB reports that she is in school at [employer]. Since COVID, MOB reports that classes have not been taking place but she plans to go back once she finds a place for infant to stay while she is at school. CSW understanding and MOB expressed that she gets [facility] and Physicist, medical. CSW offered MOB Other resources and MOB declined.   MOB reports that she doesn't have a history of any mental health. MOB has car to get her and infant to follow up appointments as needed. MOB denies feeling SI or HI. MOB reports that she has baby bed for infant to sleep in. CSW offered MOB other resources and MOB declines at this time.   CSW provided MOB with PPD and SIDS education. CSW  provided MOB with PPD Checklist to keep track of feelings related to PPD.    CSW Plan/Description:  No Further Intervention Required/No Barriers to Discharge, CSW Will Continue to Monitor Umbilical Cord Tissue Drug Screen Results and Make Report if [facility], [place], Sudden Infant Death Syndrome (SIDS) Education    Wetzel Bjornstad, [place] 02/02/2019, 11:29 AM

## 2019-02-02 NOTE — Progress Notes (Addendum)
Late entry from this morning:  Subjective: Postpartum Day #1: Cesarean Delivery for FTP/AOD; also with hx of fibroids, oligo, cHTN, and GDMA1  Patient reports tolerating PO and no problems voiding. Pt relates that when ambulating she feels dizzy. She is breast and bottlefeeding; declines contraception.  Objective: Vital signs in last 24 hours: Vitals stable and not reflective of hypovolemia. Temp:  [98.1 F (36.7 C)-98.8 F (37.1 C)] 98.2 F (36.8 C) (06/08 1525) Pulse Rate:  [72-100] 72 (06/08 1525) Resp:  [16-20] 20 (06/08 1525) BP: (108-143)/(65-90) 142/87 (06/08 1525) SpO2:  [97 %-98 %] 98 % (06/08 1525)  Physical Exam:  General: alert, cooperative and mild distress Lochia: appropriate Uterine Fundus: firm Incision: honeycomb intact, sm stains>unchanged; no evidence of distension/bleeding DVT Evaluation: No evidence of DVT seen on physical exam.  Recent Labs    02/01/19 0328 02/02/19 0455  HGB 9.5* 5.8*  HCT 29.0* 17.4*    Assessment/Plan: -Status post Cesarean section. Postoperative course complicated by severe post-op anemia, pt slightly symptomatic.  -Plan for tx of 3u PRBCs and start of po iron course. Will collect post-transfusion CBC. -Will start Vasotec 5mg  for blood pressure control  Myrtis Ser CNM 02/02/2019, 3:37 PM

## 2019-02-02 NOTE — Lactation Note (Signed)
This note was copied from a baby's chart. Lactation Consultation Note  Patient Name: Mariah Livingston MOCAR'E Date: 02/02/2019    I spoke with [name] RN regarding her feeding plan. She states that [name] still intends to breast pump/breast feed, but she wants to do so after her last transfusion. She is receiving her last unit now. Lactation follow up with [name] is recommended.  Lenore Manner 02/02/2019, 10:20 PM

## 2019-02-02 NOTE — Progress Notes (Signed)
CSW attempted to assess MOB twice. CSW aware that RN in room with MOB and infant given care. CSW to try back at another time to assess MOB for further needs.       Mariah Livingston, MSW, LCSW-A [facility] [place] [facility] [place]  [phone]

## 2019-02-03 LAB — BPAM RBC
Blood Product Expiration Date: 202007092359
Blood Product Expiration Date: 202007092359
Blood Product Expiration Date: 202007092359
ISSUE DATE / TIME: 202006081005
ISSUE DATE / TIME: 202006081324
ISSUE DATE / TIME: 202006081558
Unit Type and Rh: 5100
Unit Type and Rh: 5100
Unit Type and Rh: 5100

## 2019-02-03 LAB — TYPE AND SCREEN
ABO/RH(D): O POS
Antibody Screen: NEGATIVE
Unit division: 0
Unit division: 0
Unit division: 0

## 2019-02-03 MED ORDER — ENALAPRIL MALEATE 5 MG PO TABS
10.0000 mg | ORAL_TABLET | Freq: Every day | ORAL | Status: DC
  Administered 2019-02-04: 09:00:00 10 mg via ORAL
  Filled 2019-02-03: qty 2

## 2019-02-03 MED ORDER — ENALAPRIL MALEATE 5 MG PO TABS
5.0000 mg | ORAL_TABLET | Freq: Once | ORAL | Status: AC
  Administered 2019-02-03: 5 mg via ORAL
  Filled 2019-02-03: qty 1

## 2019-02-03 NOTE — Progress Notes (Signed)
Subjective: Postpartum Day #2: Cesarean Delivery Patient reports incisional pain, tolerating PO and no problems voiding. Denies dizziness or s/s anemia. Feeling much better since transfusion. Breastfeeding going well. Declines early d/c today.   Objective: Vital signs in last 24 hours: Temp:  [98 F (36.7 C)-98.4 F (36.9 C)] 98.1 F (36.7 C) (06/09 0609) Pulse Rate:  [66-87] 66 (06/09 0943) Resp:  [18-20] 18 (06/09 0609) BP: (127-147)/(80-96) 144/96 (06/09 0943) SpO2:  [97 %-98 %] 98 % (06/09 0609)  Physical Exam:  General: alert, cooperative and no distress Lochia: appropriate Uterine Fundus: firm Incision: honeycomb intact, unchanged DVT Evaluation: No evidence of DVT seen on physical exam.  Recent Labs    02/02/19 0455 02/02/19 2216  HGB 5.8* 9.5*  HCT 17.4* 27.6*    Assessment/Plan: Status post Cesarean section. Doing well postoperatively.  Continue current care. Pt declines early d/c today; anticipate d/c tomorrow 02/04/19. Vasotec 5mg  increased to 10mg  due to elevated BPs.  Myrtis Ser CNM 02/03/2019, 10:59 AM

## 2019-02-03 NOTE — Progress Notes (Signed)
Patient's BP 167/95 although currently on Vasotec. A retake of the blood pressure was 156/94. RN called midwife to update on initial severe range BP. Midwife informed RN that the Vasotec may take a while to kick in and to call with any other severe range Bps. RN will continue to monitor.

## 2019-02-04 LAB — COMPREHENSIVE METABOLIC PANEL
ALT: 47 U/L — ABNORMAL HIGH (ref 0–44)
ALT: 62 U/L — ABNORMAL HIGH (ref 0–44)
AST: 36 U/L (ref 15–41)
AST: 42 U/L — ABNORMAL HIGH (ref 15–41)
Albumin: 2.1 g/dL — ABNORMAL LOW (ref 3.5–5.0)
Albumin: 2.7 g/dL — ABNORMAL LOW (ref 3.5–5.0)
Alkaline Phosphatase: 107 U/L (ref 38–126)
Alkaline Phosphatase: 133 U/L — ABNORMAL HIGH (ref 38–126)
Anion gap: 8 (ref 5–15)
Anion gap: 12 (ref 5–15)
BUN: 13 mg/dL (ref 6–20)
BUN: 15 mg/dL (ref 6–20)
CO2: 22 mmol/L (ref 22–32)
CO2: 22 mmol/L (ref 22–32)
Calcium: 8.5 mg/dL — ABNORMAL LOW (ref 8.9–10.3)
Calcium: 9 mg/dL (ref 8.9–10.3)
Chloride: 108 mmol/L (ref 98–111)
Chloride: 104 mmol/L (ref 98–111)
Creatinine, Ser: 1.01 mg/dL — ABNORMAL HIGH (ref 0.44–1.00)
Creatinine, Ser: 0.79 mg/dL (ref 0.44–1.00)
GFR calc Af Amer: >60 mL/min (ref >60)
GFR calc Af Amer: >60 mL/min (ref >60)
GFR calc non Af Amer: >60 mL/min (ref >60)
GFR calc non Af Amer: >60 mL/min (ref >60)
Glucose, Bld: 126 mg/dL — ABNORMAL HIGH (ref 70–99)
Glucose, Bld: 102 mg/dL — ABNORMAL HIGH (ref 70–99)
Potassium: 4 mmol/L (ref 3.5–5.1)
Potassium: 3.8 mmol/L (ref 3.5–5.1)
Sodium: 138 mmol/L (ref 135–145)
Sodium: 138 mmol/L (ref 135–145)
Total Bilirubin: 0.6 mg/dL (ref 0.3–1.2)
Total Bilirubin: 0.4 mg/dL (ref 0.3–1.2)
Total Protein: 5.2 g/dL — ABNORMAL LOW (ref 6.5–8.1)
Total Protein: 6.5 g/dL (ref 6.5–8.1)

## 2019-02-04 LAB — CBC
HCT: 28.9 % — ABNORMAL LOW (ref 36.0–46.0)
HCT: 33.9 % — ABNORMAL LOW (ref 36.0–46.0)
Hemoglobin: 9.6 g/dL — ABNORMAL LOW (ref 12.0–15.0)
Hemoglobin: 11.7 g/dL — ABNORMAL LOW (ref 12.0–15.0)
MCH: 27.7 pg (ref 26.0–34.0)
MCH: 28.3 pg (ref 26.0–34.0)
MCHC: 33.2 g/dL (ref 30.0–36.0)
MCHC: 34.5 g/dL (ref 30.0–36.0)
MCV: 83.5 fL (ref 80.0–100.0)
MCV: 81.9 fL (ref 80.0–100.0)
Platelets: 189 10*3/uL (ref 150–400)
Platelets: 238 10*3/uL (ref 150–400)
RBC: 3.46 MIL/uL — ABNORMAL LOW (ref 3.87–5.11)
RBC: 4.14 MIL/uL (ref 3.87–5.11)
RDW: 15.9 % — ABNORMAL HIGH (ref 11.5–15.5)
RDW: 15.6 % — ABNORMAL HIGH (ref 11.5–15.5)
WBC: 8.7 10*3/uL (ref 4.0–10.5)
WBC: 10.6 10*3/uL — ABNORMAL HIGH (ref 4.0–10.5)
nRBC: 0.2 % (ref 0.0–0.2)
nRBC: 0.3 % — ABNORMAL HIGH (ref 0.0–0.2)

## 2019-02-04 LAB — MAGNESIUM: Magnesium: 4.4 mg/dL — ABNORMAL HIGH (ref 1.7–2.4)

## 2019-02-04 LAB — ABO/RH: ABO/RH(D): O POS

## 2019-02-04 MED ORDER — AMLODIPINE BESYLATE 5 MG PO TABS
5.0000 mg | ORAL_TABLET | Freq: Every day | ORAL | Status: DC
  Administered 2019-02-04: 5 mg via ORAL
  Filled 2019-02-04: qty 1

## 2019-02-04 MED ORDER — HYDRALAZINE HCL 20 MG/ML IJ SOLN: 10.0000 mg | INTRAMUSCULAR | Status: DC | PRN

## 2019-02-04 MED ORDER — AMLODIPINE BESYLATE 5 MG PO TABS
5.0000 mg | ORAL_TABLET | Freq: Once | ORAL | Status: AC
  Administered 2019-02-04: 14:00:00 5 mg via ORAL
  Filled 2019-02-04: qty 1

## 2019-02-04 MED ORDER — LABETALOL HCL 5 MG/ML IV SOLN: 20.0000 mg | INTRAVENOUS | Status: DC | PRN

## 2019-02-04 MED ORDER — MAGNESIUM SULFATE 40 G IN LACTATED RINGERS - SIMPLE
2.0000 g/h | INTRAVENOUS | Status: AC
  Administered 2019-02-05: 2 g/h via INTRAVENOUS
  Filled 2019-02-04 (×3): qty 500

## 2019-02-04 MED ORDER — AMLODIPINE BESYLATE 10 MG PO TABS
10.0000 mg | ORAL_TABLET | Freq: Every day | ORAL | Status: DC
  Administered 2019-02-05 – 2019-02-06 (×2): 10 mg via ORAL
  Filled 2019-02-04 (×2): qty 1

## 2019-02-04 MED ORDER — LABETALOL HCL 5 MG/ML IV SOLN: 40.0000 mg | INTRAVENOUS | Status: DC | PRN

## 2019-02-04 MED ORDER — ENALAPRIL MALEATE 10 MG PO TABS
10.0000 mg | ORAL_TABLET | Freq: Two times a day (BID) | ORAL | Status: DC
  Administered 2019-02-04 – 2019-02-06 (×4): 10 mg via ORAL
  Filled 2019-02-04 (×4): qty 1

## 2019-02-04 MED ORDER — MAGNESIUM SULFATE BOLUS VIA INFUSION
6.0000 g | Freq: Once | INTRAVENOUS | Status: AC
  Administered 2019-02-04: 6 g via INTRAVENOUS
  Filled 2019-02-04: qty 500

## 2019-02-04 MED ORDER — LACTATED RINGERS IV SOLN
INTRAVENOUS | Status: DC
  Administered 2019-02-04 – 2019-02-05 (×2): via INTRAVENOUS

## 2019-02-04 MED ORDER — LABETALOL HCL 5 MG/ML IV SOLN: 80.0000 mg | INTRAVENOUS | Status: DC | PRN

## 2019-02-04 NOTE — Progress Notes (Signed)
Subjective: Postpartum Day 3: Cesarean Delivery Patient reports no complaints. States getting up out of bed some, pain mostly well-controlled. Denies headaches, vision changes, shortness of breath. Denies lightheadedness.   Objective: Vital signs in last 24 hours: Temp:  [97.9 F (36.6 C)-98.9 F (37.2 C)] 98.9 F (37.2 C) (06/10 0600) Pulse Rate:  [60-65] 61 (06/10 0922) Resp:  [16-18] 16 (06/10 0917) BP: (145-167)/(91-102) 145/102 (06/10 4356)  Physical Exam:  General: alert, well-appearing, NAD Lochia: appropriate Uterine Fundus: firm Incision: honeycomb dressing C/D/I DVT Evaluation: 2+ pitting edema up to thigh  Recent Labs    02/02/19 0455 02/02/19 2216  HGB 5.8* 9.5*  HCT 17.4* 27.6*    Assessment/Plan: Status post Cesarean section. Doing well postoperatively.  Continue current care. BP borderline severe range, asymptomatic, known cHTN - will add on amlodipine to Enalapril, recheck CMP, CBC. If improved, may be able to discharge home later today  Glenice Bow, DO 02/04/2019, 9:48 AM

## 2019-02-04 NOTE — Progress Notes (Signed)
Interim Progress Note  S Received call about high blood pressures for patient.   O  Patient Vitals for the past 24 hrs:  BP Temp Temp src Pulse Resp SpO2  02/03/19 2350 (!) 158/92 98 F (36.7 C) Oral 61 17 -  02/03/19 2217 (!) 157/91 97.9 F (36.6 C) Axillary 60 17 -  02/03/19 1930 (!) 158/92 98.1 F (36.7 C) Oral 62 18 -  02/03/19 1351 (!) 156/94 98.3 F (36.8 C) Oral 65 17 -  02/03/19 1345 (!) 167/95 - - - - -  02/03/19 1238 (!) 156/94 - - - - -  02/03/19 0943 (!) 144/96 - - 66 - -  02/03/19 0609 (!) 144/94 98.1 F (36.7 C) Oral 69 18 98 %    AP # HTN  Will continue 10mg  enalapril in the AM that was just recently started. Will also add amlodipine 5 mg. Can continue to titrate PRN. Can also consider adding HCTZ. Patient previously on HCTZ monotherapy, though, unclear if HTN well controlled. Creatinine on 01/30/19 was 0.67. Will likely see elevation with initiating enalipril therapy. Can also consider nocturnal dosing.   Adding 5mg  amlodipine to be dosed at 10am   Zettie Cooley, M.D.  Family Medicine  PGY-1 02/04/2019 1:29 AM

## 2019-02-04 NOTE — Progress Notes (Signed)
Morning blood pressures were 160/100 and 145/102. Pt denies any headache, blurred vision, seeing spots, and/or epigastric pain. Vasotec and Norvasc were administered per order. RN called Resident to update on increasing Bps. Resident informed RN that we will continue to monitor the patient and wait for the CBC and CMP labs to result to decide further intervention, if necessary. RN agrees to plan and will continue to monitor patient.

## 2019-02-05 LAB — COMPREHENSIVE METABOLIC PANEL
ALT: 57 U/L — ABNORMAL HIGH (ref 0–44)
AST: 36 U/L (ref 15–41)
Albumin: 2.5 g/dL — ABNORMAL LOW (ref 3.5–5.0)
Alkaline Phosphatase: 122 U/L (ref 38–126)
Anion gap: 13 (ref 5–15)
BUN: 11 mg/dL (ref 6–20)
CO2: 24 mmol/L (ref 22–32)
Calcium: 8.2 mg/dL — ABNORMAL LOW (ref 8.9–10.3)
Chloride: 101 mmol/L (ref 98–111)
Creatinine, Ser: 1.06 mg/dL — ABNORMAL HIGH (ref 0.44–1.00)
GFR calc Af Amer: >60 mL/min (ref >60)
GFR calc non Af Amer: >60 mL/min (ref >60)
Glucose, Bld: 118 mg/dL — ABNORMAL HIGH (ref 70–99)
Potassium: 3.5 mmol/L (ref 3.5–5.1)
Sodium: 138 mmol/L (ref 135–145)
Total Bilirubin: 0.8 mg/dL (ref 0.3–1.2)
Total Protein: 6.1 g/dL — ABNORMAL LOW (ref 6.5–8.1)

## 2019-02-05 LAB — CBC
HCT: 33.5 % — ABNORMAL LOW (ref 36.0–46.0)
Hemoglobin: 11.6 g/dL — ABNORMAL LOW (ref 12.0–15.0)
MCH: 28.4 pg (ref 26.0–34.0)
MCHC: 34.6 g/dL (ref 30.0–36.0)
MCV: 82.1 fL (ref 80.0–100.0)
Platelets: 244 10*3/uL (ref 150–400)
RBC: 4.08 MIL/uL (ref 3.87–5.11)
RDW: 15.7 % — ABNORMAL HIGH (ref 11.5–15.5)
WBC: 10.2 10*3/uL (ref 4.0–10.5)
nRBC: 0.4 % — ABNORMAL HIGH (ref 0.0–0.2)

## 2019-02-05 LAB — MAGNESIUM: Magnesium: 5.9 mg/dL — ABNORMAL HIGH (ref 1.7–2.4)

## 2019-02-05 MED ORDER — MAGNESIUM HYDROXIDE 400 MG/5ML PO SUSP
15.0000 mL | Freq: Once | ORAL | Status: AC
  Administered 2019-02-05: 15 mL via ORAL
  Filled 2019-02-05: qty 30

## 2019-02-05 MED ORDER — POLYETHYLENE GLYCOL 3350 17 G PO PACK
17.0000 g | PACK | Freq: Every day | ORAL | Status: DC
  Administered 2019-02-05 – 2019-02-06 (×2): 17 g via ORAL
  Filled 2019-02-05 (×2): qty 1

## 2019-02-05 NOTE — Progress Notes (Signed)
Magnesium stopped

## 2019-02-05 NOTE — Lactation Note (Signed)
This note was copied from a baby's chart. Lactation Consultation Note:   Mother reports to [facility] that she wants to do both, breastfeed and bottle. Discussed using her own milk to supplement with.   Mother reports that her breast are full. Assessed mothers breast and are very full . When hand expressed mother had large flow.   Suggested the use of a hand pump or an electric pump.  Mother reports that she would like the hand pump.  Mother sat up at the bedside and started pumping with the hand pump. She was advised to pump every 2-3 hours or after feedings to drain breast well.  Observed good flow into the bottle.  Mother receptive to all teaching. Mother aware that she can call for support.   Patient Name: Mariah Livingston [id number]'[id number] Date: 02/05/2019 Reason for consult: Follow-up assessment   Maternal Data    Feeding Feeding Type: Formula Nipple Type: Slow - flow  LATCH Score                   Interventions Interventions: Hand pump(harmony hand pump given with instructions. )  Lactation Tools Discussed/Used     Consult Status Consult Status: Follow-up Date: 02/06/19 Follow-up type: In-patient    Jess Barters [facility] 02/05/2019, 3:30 PM

## 2019-02-05 NOTE — Progress Notes (Signed)
Postpartum Day 4: Cesarean Delivery  Subjective: Patient was moved to [facility] yesterday for treatment of superimposed severe preeclampsia with CHTN given severe range BP and worsening labs.  Was started on magnesium sulfate for eclampsia prophylaxis, HTN regimen changed.  Overnight, no sentinel events.  Patient denies any headaches, visual symptoms, RUQ/epigastric pain or other concerning symptoms.Reports constipation, but has flatus. Minimal incisional pain.  Baby sleeping at bedside, formula fed.   Objective: Vital signs in last 24 hours: Temp:  [97.5 F (36.4 C)-98.5 F (36.9 C)] 97.5 F (36.4 C) (06/11 0800) Pulse Rate:  [61-86] 83 (06/11 0800) Resp:  [16-18] 18 (06/11 0800) BP: (141-171)/(83-102) 153/91 (06/11 0800) SpO2:  [99 %-100 %] 100 % (06/11 0800)  Patient Vitals for the past 24 hrs:  BP Temp Temp src Pulse Resp SpO2  02/05/19 0800 (!) 153/91 (!) 97.5 F (36.4 C) Oral 83 18 100 %  02/05/19 0514 (!) 149/94 97.7 F (36.5 C) Oral 86 16 100 %  02/05/19 0054 (!) 149/91 97.8 F (36.6 C) Oral 75 16 99 %  02/05/19 0001 (!) 155/97 - - 74 16 100 %  02/04/19 2304 (!) 143/95 - - 71 16 99 %  02/04/19 2203 (!) 144/87 - - 75 16 100 %  02/04/19 2101 (!) 150/94 - - 76 16 -  02/04/19 2000 (!) 152/94 - - 70 16 100 %  02/04/19 1926 (!) 144/93 98.3 F (36.8 C) Oral 71 16 100 %  02/04/19 1852 (!) 141/88 - - 71 17 -  02/04/19 1800 (!) 146/89 - - 84 16 100 %  02/04/19 1746 (!) 148/83 - - 75 16 -  02/04/19 1742 (!) 144/91 - - 75 16 -  02/04/19 1716 (!) 155/87 98.2 F (36.8 C) Oral 76 16 100 %  02/04/19 1445 (!) 162/94 - - 71 - -  02/04/19 1337 (!) 161/97 98.5 F (36.9 C) Oral 66 16 -  02/04/19 1200 (!) 171/89 - - 63 - -  02/04/19 0922 (!) 145/102 - - 61 - -  02/04/19 0917 (!) 160/100 - - 62 16 -    Physical Exam:  General: alert, well-appearing, NAD Lochia: appropriate Uterine Fundus: firm, NT Incision: honeycomb dressing C/D/I DVT Evaluation: 1+ BLE edema  Labs: CBC Latest Ref  Rng & Units 02/05/2019 02/04/2019 02/04/2019  WBC 4.0 - 10.5 K/uL 10.2 10.6(H) 8.7  Hemoglobin 12.0 - 15.0 g/dL 11.6(L) 11.7(L) 9.6(L)  Hematocrit 36.0 - 46.0 % 33.5(L) 33.9(L) 28.9(L)  Platelets 150 - 400 K/uL 244 238 189   CMP Latest Ref Rng & Units 02/05/2019 02/04/2019 02/04/2019  Glucose 70 - 99 mg/dL 118(H) 102(H) 126(H)  BUN 6 - 20 mg/dL 11 15 13   Creatinine 0.44 - 1.00 mg/dL 1.06(H) 0.79 1.01(H)  Sodium 135 - 145 mmol/L 138 138 138  Potassium 3.5 - 5.1 mmol/L 3.5 3.8 4.0  Chloride 98 - 111 mmol/L 101 104 108  CO2 22 - 32 mmol/L 24 22 22   Calcium 8.9 - 10.3 mg/dL 8.2(L) 9.0 8.5(L)  Total Protein 6.5 - 8.1 g/dL 6.1(L) 6.5 5.2(L)  Total Bilirubin 0.3 - 1.2 mg/dL 0.8 0.4 0.6  Alkaline Phos 38 - 126 U/L 122 133(H) 107  AST 15 - 41 U/L 36 42(H) 36  ALT 0 - 44 U/L 57(H) 62(H) 47(H)   Magnesium level this morning 5.9  Assessment/Plan: Status post Cesarean section. Postpartum course complicated by Severe Preeclampsia superimposed on chronic hypertension. *Continue Enalapril 10 mg bid, Amlodipine 10 mg daily for now. Continue magnesium sulfate until  around 1630 and watch BP afterwards. If stable,consider discharge in morning. *Labs stable, will recheck in morning *No other postoperative concerns Continue current care.   Verita Schneiders, MD 02/05/2019, 8:47 AM

## 2019-02-06 DIAGNOSIS — Z98891 History of uterine scar from previous surgery: Secondary | ICD-10-CM

## 2019-02-06 LAB — CBC
HCT: 34.8 % — ABNORMAL LOW (ref 36.0–46.0)
Hemoglobin: 11.8 g/dL — ABNORMAL LOW (ref 12.0–15.0)
MCH: 28.2 pg (ref 26.0–34.0)
MCHC: 33.9 g/dL (ref 30.0–36.0)
MCV: 83.1 fL (ref 80.0–100.0)
Platelets: 268 10*3/uL (ref 150–400)
RBC: 4.19 MIL/uL (ref 3.87–5.11)
RDW: 15.8 % — ABNORMAL HIGH (ref 11.5–15.5)
WBC: 8.4 10*3/uL (ref 4.0–10.5)
nRBC: 0 % (ref 0.0–0.2)

## 2019-02-06 LAB — COMPREHENSIVE METABOLIC PANEL
ALT: 43 U/L (ref 0–44)
AST: 25 U/L (ref 15–41)
Albumin: 2.6 g/dL — ABNORMAL LOW (ref 3.5–5.0)
Alkaline Phosphatase: 113 U/L (ref 38–126)
Anion gap: 12 (ref 5–15)
BUN: 16 mg/dL (ref 6–20)
CO2: 24 mmol/L (ref 22–32)
Calcium: 8 mg/dL — ABNORMAL LOW (ref 8.9–10.3)
Chloride: 100 mmol/L (ref 98–111)
Creatinine, Ser: 0.9 mg/dL (ref 0.44–1.00)
GFR calc Af Amer: >60 mL/min (ref >60)
GFR calc non Af Amer: >60 mL/min (ref >60)
Glucose, Bld: 93 mg/dL (ref 70–99)
Potassium: 3.8 mmol/L (ref 3.5–5.1)
Sodium: 136 mmol/L (ref 135–145)
Total Bilirubin: 0.5 mg/dL (ref 0.3–1.2)
Total Protein: 6.4 g/dL — ABNORMAL LOW (ref 6.5–8.1)

## 2019-02-06 MED ORDER — IBUPROFEN 600 MG PO TABS: 600.0000 mg | ORAL_TABLET | Freq: Four times a day (QID) | ORAL | 2 refills | Status: AC | PRN

## 2019-02-06 MED ORDER — FERROUS FUMARATE 324 (106 FE) MG PO TABS: 1.0000 | ORAL_TABLET | Freq: Two times a day (BID) | ORAL | 2 refills | Status: AC

## 2019-02-06 MED ORDER — SENNOSIDES-DOCUSATE SODIUM 8.6-50 MG PO TABS: 2.0000 | ORAL_TABLET | Freq: Two times a day (BID) | ORAL | 2 refills | Status: AC | PRN

## 2019-02-06 MED ORDER — OXYCODONE HCL 5 MG PO TABS: 5.0000 mg | ORAL_TABLET | ORAL | 0 refills | Status: AC | PRN

## 2019-02-06 MED ORDER — ENALAPRIL MALEATE 10 MG PO TABS: 10.0000 mg | ORAL_TABLET | Freq: Two times a day (BID) | ORAL | 1 refills | Status: AC

## 2019-02-06 MED ORDER — AMLODIPINE BESYLATE 10 MG PO TABS: 10.0000 mg | ORAL_TABLET | Freq: Every day | ORAL | 1 refills | Status: AC

## 2019-02-06 NOTE — Lactation Note (Signed)
This note was copied from a baby's chart. Lactation Consultation Note  Patient Name: Mariah Livingston [id number] Date: 02/06/2019 Reason for consult: Follow-up assessment  Mom has been doing mostly bottles since last LC assessment. Mom and baby are going home today, baby is 22 days old and has already recovered birth weight. Checked with Mariah Livingston her RN and mom declined any assistance with lactation, LC made it to the room though, but mom still declined.  Maternal Data    Feeding Feeding Type: Bottle Fed - Formula Nipple Type: Slow - flow   Interventions    Lactation Tools Discussed/Used     Consult Status Consult Status: Complete    Mariah Livingston 02/06/2019, 11:26 AM

## 2019-02-06 NOTE — Discharge Instructions (Signed)
Cesarean Delivery, Care After This sheet gives you information about how to care for yourself after your procedure. Your health care provider may also give you more specific instructions. If you have problems or questions, contact your health care provider. What can I expect after the procedure? After the procedure, it is common to have:  A small amount of blood or clear fluid coming from the incision.  Some redness, swelling, and pain in your incision area.  Some abdominal pain and soreness.  Vaginal bleeding (lochia). Even though you did not have a vaginal delivery, you will still have vaginal bleeding and discharge.  Pelvic cramps.  Fatigue. You may have pain, swelling, and discomfort in the tissue between your vagina and your anus (perineum) if:  Your C-section was unplanned, and you were allowed to labor and push.  An incision was made in the area (episiotomy) or the tissue tore during attempted vaginal delivery. Follow these instructions at home: Incision care   Follow instructions from your health care provider about how to take care of your incision. Make sure you: ? Wash your hands with soap and water before you change your bandage (dressing). If soap and water are not available, use hand sanitizer. ? If you have a dressing, change it or remove it as told by your health care provider. ? Leave stitches (sutures), skin staples, skin glue, or adhesive strips in place. These skin closures may need to stay in place for 2 weeks or longer. If adhesive strip edges start to loosen and curl up, you may trim the loose edges. Do not remove adhesive strips completely unless your health care provider tells you to do that.  Check your incision area every day for signs of infection. Check for: ? More redness, swelling, or pain. ? More fluid or blood. ? Warmth. ? Pus or a bad smell.  Do not take baths, swim, or use a hot tub until your health care provider says it's okay. Ask your  health care provider if you can take showers.  When you cough or sneeze, hug a pillow. This helps with pain and decreases the chance of your incision opening up (dehiscing). Do this until your incision heals. Medicines  Take over-the-counter and prescription medicines only as told by your health care provider.  If you were prescribed an antibiotic medicine, take it as told by your health care provider. Do not stop taking the antibiotic even if you start to feel better.  Do not drive or use heavy machinery while taking prescription pain medicine. Lifestyle  Do not drink alcohol. This is especially important if you are breastfeeding or taking pain medicine.  Do not use any products that contain nicotine or tobacco, such as cigarettes, e-cigarettes, and chewing tobacco. If you need help quitting, ask your health care provider. Eating and drinking  Drink at least 8 eight-ounce glasses of water every day unless told not to by your health care provider. If you breastfeed, you may need to drink even more water.  Eat high-fiber foods every day. These foods may help prevent or relieve constipation. High-fiber foods include: ? Whole grain cereals and breads. ? Brown rice. ? Beans. ? Fresh fruits and vegetables. Activity   If possible, have someone help you care for your baby and help with household activities for at least a few days after you leave the hospital.  Return to your normal activities as told by your health care provider. Ask your health care provider what activities are safe for  you.  Rest as much as possible. Try to rest or take a nap while your baby is sleeping.  Do not lift anything that is heavier than 10 lbs (4.5 kg), or the limit that you were told, until your health care provider says that it is safe.  Talk with your health care provider about when you can engage in sexual activity. This may depend on your: ? Risk of infection. ? How fast you heal. ? Comfort and desire  to engage in sexual activity. General instructions  Do not use tampons or douches until your health care provider approves.  Wear loose, comfortable clothing and a supportive and well-fitting bra.  Keep your perineum clean and dry. Wipe from front to back when you use the toilet.  If you pass a blood clot, save it and call your health care provider to discuss. Do not flush blood clots down the toilet before you get instructions from your health care provider.  Keep all follow-up visits for you and your baby as told by your health care provider. This is important. Contact a health care provider if:  You have: ? A fever. ? Bad-smelling vaginal discharge. ? Pus or a bad smell coming from your incision. ? Difficulty or pain when urinating. ? A sudden increase or decrease in the frequency of your bowel movements. ? More redness, swelling, or pain around your incision. ? More fluid or blood coming from your incision. ? A rash. ? Nausea. ? Little or no interest in activities you used to enjoy. ? Questions about caring for yourself or your baby.  Your incision feels warm to the touch.  Your breasts turn red or become painful or hard.  You feel unusually sad or worried.  You vomit.  You pass a blood clot from your vagina.  You urinate more than usual.  You are dizzy or light-headed. Get help right away if:  You have: ? Pain that does not go away or get better with medicine. ? Chest pain. ? Difficulty breathing. ? Blurred vision or spots in your vision. ? Thoughts about hurting yourself or your baby. ? New pain in your abdomen or in one of your legs. ? A severe headache.  You faint.  You bleed from your vagina so much that you fill more than one sanitary pad in one hour. Bleeding should not be heavier than your heaviest period. Summary  After the procedure, it is common to have pain at your incision site, abdominal cramping, and slight bleeding from your vagina.  Check  your incision area every day for signs of infection.  Tell your health care provider about any unusual symptoms.  Keep all follow-up visits for you and your baby as told by your health care provider. This information is not intended to replace advice given to you by your health care provider. Make sure you discuss any questions you have with your health care provider. Document Released: 05/05/2002 Document Revised: 02/19/2018 Document Reviewed: 02/19/2018 Elsevier Interactive Patient Education  2019 [employer].  Postpartum Hypertension Postpartum hypertension is high blood pressure that remains higher than normal after childbirth. You may not realize that you have postpartum hypertension if your blood pressure is not being checked regularly. In most cases, postpartum hypertension will go away on its own, usually within a week of delivery. However, for some women, medical treatment is required to prevent serious complications, such as seizures or stroke. What are the causes? This condition may be caused by one or more  of the following:  Hypertension that existed before pregnancy (chronic hypertension).  Hypertension that comes on as a result of pregnancy (gestational hypertension).  Hypertensive disorders during pregnancy (preeclampsia) or seizures in women who have high blood pressure during pregnancy (eclampsia).  A condition in which the liver, platelets, and red blood cells are damaged during pregnancy (HELLP syndrome).  A condition in which the thyroid produces too much hormones (hyperthyroidism).  Other rare problems of the nerves (neurological disorders) or blood disorders. In some cases, the cause may not be known. What increases the risk? The following factors may make you more likely to develop this condition:  Chronic hypertension. In some cases, this may not have been diagnosed before pregnancy.  Obesity.  Type 2 diabetes.  Kidney disease.  History of preeclampsia  or eclampsia.  Other medical conditions that change the level of hormones in the body (hormonal imbalance). What are the signs or symptoms? As with all types of hypertension, postpartum hypertension may not have any symptoms. Depending on how high your blood pressure is, you may experience:  Headaches. These may be mild, moderate, or severe. They may also be steady, constant, or sudden in onset (thunderclap headache).  Changes in your ability to see (visual changes).  Dizziness.  Shortness of breath.  Swelling of your hands, feet, lower legs, or face. In some cases, you may have swelling in more than one of these locations.  Heart palpitations or a racing heartbeat.  Difficulty breathing while lying down.  Decrease in the amount of urine that you pass. Other rare signs and symptoms may include:  Sweating more than usual. This lasts longer than a few days after delivery.  Chest pain.  Sudden dizziness when you get up from sitting or lying down.  Seizures.  Nausea or vomiting.  Abdominal pain. How is this diagnosed? This condition may be diagnosed based on the results of a physical exam, blood pressure measurements, and blood and urine tests. You may also have other tests, such as a CT scan or an MRI, to check for other problems of postpartum hypertension. How is this treated? If blood pressure is high enough to require treatment, your options may include:  Medicines to reduce blood pressure (antihypertensives). Tell your health care provider if you are breastfeeding or if you plan to breastfeed. There are many antihypertensive medicines that are safe to take while breastfeeding.  Stopping medicines that may be causing hypertension.  Treating medical conditions that are causing hypertension.  Treating the complications of hypertension, such as seizures, stroke, or kidney problems. Your health care provider will also continue to monitor your blood pressure closely until it  is within a safe range for you. Follow these instructions at home:  Take over-the-counter and prescription medicines only as told by your health care provider.  Return to your normal activities as told by your health care provider. Ask your health care provider what activities are safe for you.  Do not use any products that contain nicotine or tobacco, such as cigarettes and e-cigarettes. If you need help quitting, ask your health care provider.  Keep all follow-up visits as told by your health care provider. This is important. Contact a health care provider if:  Your symptoms get worse.  You have new symptoms, such as: ? A headache that does not get better. ? Dizziness. ? Visual changes. Get help right away if:  You suddenly develop swelling in your hands, ankles, or face.  You have sudden, rapid weight gain.  You develop difficulty breathing, chest pain, racing heartbeat, or heart palpitations.  You develop severe pain in your abdomen.  You have any symptoms of a stroke. "BE FAST" is an easy way to remember the main warning signs of a stroke: ? B - Balance. Signs are dizziness, sudden trouble walking, or loss of balance. ? E - Eyes. Signs are trouble seeing or a sudden change in vision. ? F - Face. Signs are sudden weakness or numbness of the face, or the face or eyelid drooping on one side. ? A - Arms. Signs are weakness or numbness in an arm. This happens suddenly and usually on one side of the body. ? S - Speech. Signs are sudden trouble speaking, slurred speech, or trouble understanding what people say. ? T - Time. Time to call emergency services. Write down what time symptoms started.  You have other signs of a stroke, such as: ? A sudden, severe headache with no known cause. ? Nausea or vomiting. ? Seizure. These symptoms may represent a serious problem that is an emergency. Do not wait to see if the symptoms will go away. Get medical help right away. Call your local  emergency services (911 in the [place] Do not drive yourself to the hospital. Summary  Postpartum hypertension is high blood pressure that remains higher than normal after childbirth.  In most cases, postpartum hypertension will go away on its own, usually within a week of delivery.  For some women, medical treatment is required to prevent serious complications, such as seizures or stroke. This information is not intended to replace advice given to you by your health care provider. Make sure you discuss any questions you have with your health care provider. Document Released: 04/16/2014 Document Revised: 06/03/2017 Document Reviewed: 06/03/2017 Elsevier Interactive Patient Education  2019 [employer].

## 2019-02-10 ENCOUNTER — Telehealth: Payer: Self-pay | Admitting: *Deleted

## 2019-02-10 NOTE — Telephone Encounter (Signed)
Nurse from Health dept called to office regarding pt BP reading that was given today. BP reading was 172/122 this morning per pt. States that pt has not been able to get medication from pharmacy, has no one that can go for her.  Call was then placed to pt this afternoon and had pt repeat BP, reading of 178/113. Pt denies any symptoms at this time, no HA, visual changes, swelling changes. Pt was made aware that it is very important for her to get medication and begin to take.  Pt was advised to call family/friend that may be able to help her get her medication.  Pt states that she has not been able to get in contact with anyone. Pt made aware that if continues she will need to be seen at hospital for eval.  Pt made aware that I will contact her tomorrow morning and verify if she was able to get Rx's.  Pt states understanding.

## 2019-02-11 ENCOUNTER — Telehealth: Payer: Self-pay | Admitting: *Deleted

## 2019-02-11 NOTE — Telephone Encounter (Signed)
Placed call to pt to follow up on BP. Pt states she was able to get her medication and did start taking today. Pt states her BP reading today is 150/111, denies any symptoms at this time. Pt states she does not have anyone to help her and she could not come to appt today for BP check. Pt advised to continue on BP meds and continue to check BP and call office as needed.  Pt states understanding.

## 2019-02-11 NOTE — Telephone Encounter (Signed)
Attempt to contact pt to verify if she was able to get BP medication and if she was able to make appt today in office. No answer, LM on VM.

## 2019-02-12 ENCOUNTER — Telehealth: Payer: Self-pay

## 2019-02-12 NOTE — Telephone Encounter (Signed)
TC to pt to f/u BP. LVM for pt to c/b

## 2019-02-17 ENCOUNTER — Other Ambulatory Visit: Payer: Self-pay

## 2019-02-17 ENCOUNTER — Ambulatory Visit (INDEPENDENT_AMBULATORY_CARE_PROVIDER_SITE_OTHER): Payer: Medicaid Other | Admitting: Obstetrics and Gynecology

## 2019-02-17 ENCOUNTER — Encounter: Payer: Self-pay | Admitting: Obstetrics and Gynecology

## 2019-02-17 VITALS — BP 130/87 | HR 92 | Wt 158.0 lb

## 2019-02-17 DIAGNOSIS — Z9889 Other specified postprocedural states: Secondary | ICD-10-CM

## 2019-02-17 NOTE — Progress Notes (Signed)
Pt states she is still having some pain from incision, taking Tylenol. Pt is taking BP medication.

## 2019-02-17 NOTE — Progress Notes (Signed)
38 yo P1041 s/p primary cesarean section 2 weeks ago due to failure to progress here for incision and BP check. Patient reports feeling well and denies fever, or abnormal drainage from her incision. Patient reports pain is well controlled with tylenol and ibuprofen. Patient is breast and bottle feeding. She remains abstinent and is not interested in contraception. She is taking the enalapril as prescribed  Blood pressure 130/87, pulse 92, weight 158 lb (71.7 kg), last menstrual period 04/30/2018, unknown if currently breastfeeding. GENERAL: Well-developed, well-nourished female in no acute distress.  ABDOMEN: Soft, nontender, nondistended. No organomegaly. Incision: healing well without erythema, induration or drainage PELVIC: Not indicated EXTREMITIES: No cyanosis, clubbing, or edema, 2+ distal pulses.  A/P 38 yo here for post op incision check s/p c-section - Continue pain management prn - Incision is healing well - Continue BP medication - Follow up in 4 weeks for postpartum check with glucola

## 2019-02-22 NOTE — Addendum Note (Signed)
Addendum  created 02/22/19 1811 by Effie Berkshire, MD   Intraprocedure Event edited

## 2019-03-10 ENCOUNTER — Encounter: Payer: Self-pay | Admitting: Obstetrics

## 2019-03-10 ENCOUNTER — Other Ambulatory Visit: Payer: Self-pay

## 2019-03-10 ENCOUNTER — Ambulatory Visit (INDEPENDENT_AMBULATORY_CARE_PROVIDER_SITE_OTHER): Payer: Medicaid Other | Admitting: Obstetrics

## 2019-03-10 ENCOUNTER — Other Ambulatory Visit: Payer: Medicaid Other

## 2019-03-10 DIAGNOSIS — O24430 Gestational diabetes mellitus in the puerperium, diet controlled: Secondary | ICD-10-CM

## 2019-03-10 DIAGNOSIS — O1415 Severe pre-eclampsia, complicating the puerperium: Secondary | ICD-10-CM

## 2019-03-10 DIAGNOSIS — Z3009 Encounter for other general counseling and advice on contraception: Secondary | ICD-10-CM

## 2019-03-10 NOTE — Progress Notes (Signed)
Post Partum Exam  Mariah Livingston is a 38 y.o. G31P1041 female who presents for a postpartum visit. She is 5 weeks postpartum following a low cervical transverse Cesarean section. I have fully reviewed the prenatal and intrapartum course. The delivery was at [redacted]w[redacted]d gestational weeks.  Anesthesia: spinal. Postpartum course has been unremarkable. Baby's course has been unremarkable. Baby is feeding by both breast and bottle - Carnation Good Start Soy and Similac Advance. Bleeding yes "some days are heavier than others". Bowel function is normal. Bladder function is normal. Patient is not sexually active. Contraception method is condoms.. Postpartum depression screening:neg EPDS= 0  The following portions of the patient's history were reviewed and updated as appropriate: allergies, current medications, past family history, past medical history, past social history, past surgical history and problem list. Last pap smear done unknown and was unknown  Review of Systems A comprehensive review of systems was negative.    Objective:  Blood pressure 114/78, pulse 73, temperature 98.1 F (36.7 C), height 5\' 6"  (1.676 m), weight 151 lb 3.2 oz (68.6 kg), last menstrual period 04/30/2018, unknown if currently breastfeeding.  General:  alert and no distress   Breasts:  inspection negative, no nipple discharge or bleeding, no masses or nodularity palpable  Lungs: clear to auscultation bilaterally  Heart:  regular rate and rhythm, S1, S2 normal, no murmur, click, rub or gallop  Abdomen: soft, non-tender; bowel sounds normal; no masses,  no organomegaly and incision is clean, dry and intact   Assessment:   1. Postpartum examination following cesarean delivery - doing well  2. Diet controlled gestational diabetes mellitus (GDM) in puerperium Rx: - Glucose tolerance, 2 hours  3. Encounter for other general counseling or advice on contraception - wants to use condoms only  4. Hypertension in pregnancy,  pre-eclampsia, severe, delivered/postpartum - clinically stable  Plan:   1. Contraception: condoms 2. May discontinue BP meds 3. Follow up in: 3 months or as needed.   Shelly Bombard MD 03-10-2019

## 2019-03-11 LAB — GLUCOSE TOLERANCE, 2 HOURS
Glucose, 2 hour: 141 mg/dL — ABNORMAL HIGH (ref 65–139)
Glucose, GTT - Fasting: 99 mg/dL (ref 65–99)

## 2019-09-27 IMAGING — US US MFM FETAL BPP WO NON STRESS
1 series · 13 of 28 positions shown · non-contrast
Comparison: none

[Series 1: us mfm fetal bpp wo non stress · 39 acquisitions, 13 frames shown]
[im 2/39]
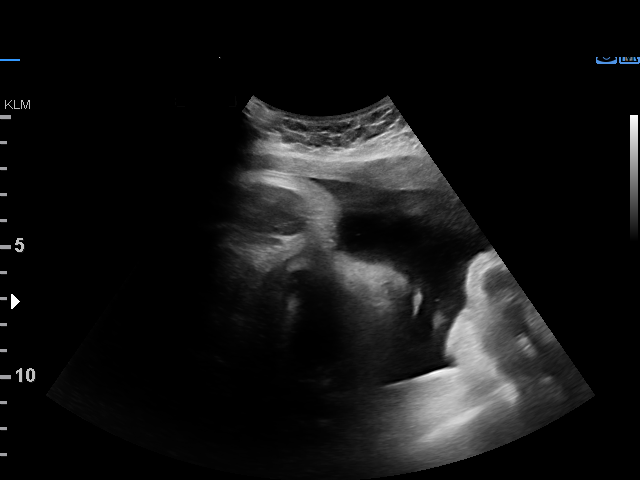
[im 5/39]
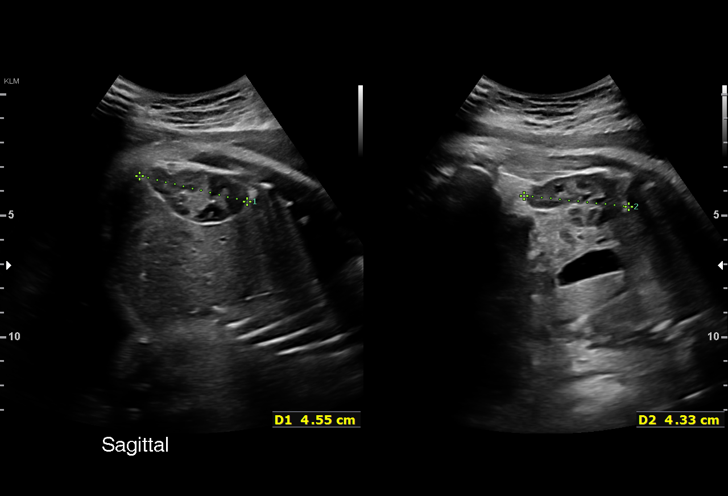
[im 8/39]
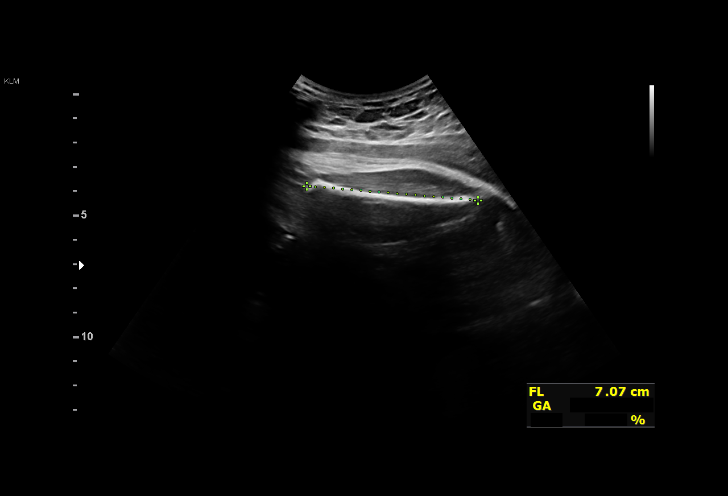
[im 10/39]
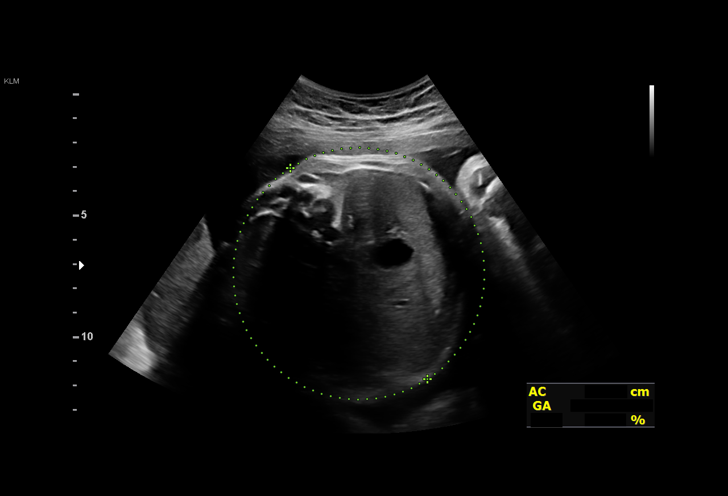
[im 13/39]
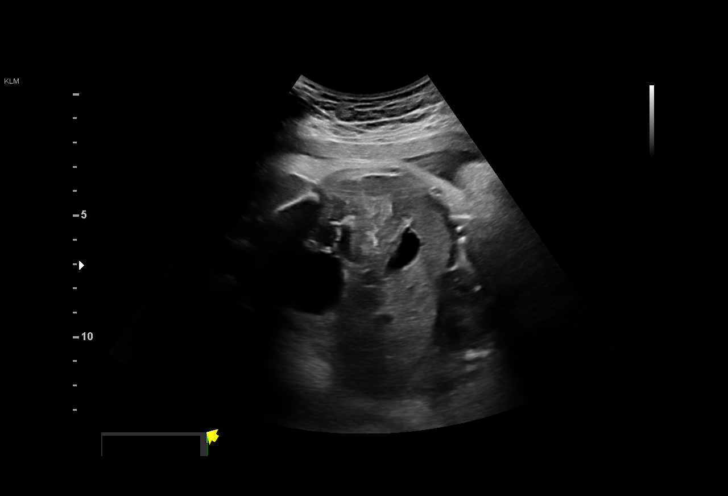
[im 16/39]
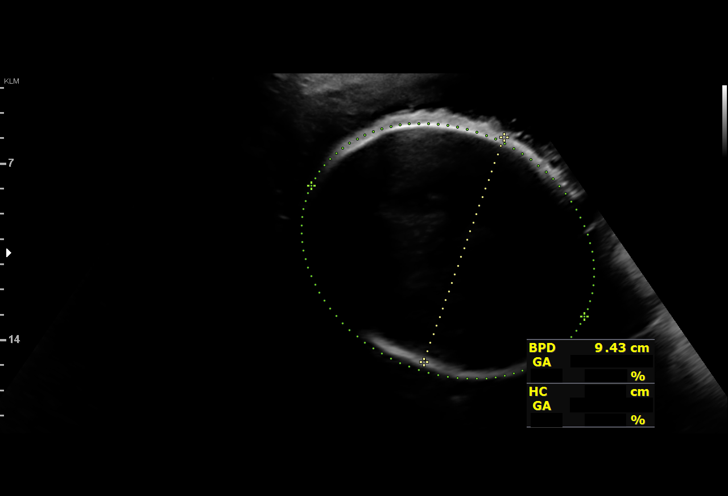
[im 20/39]
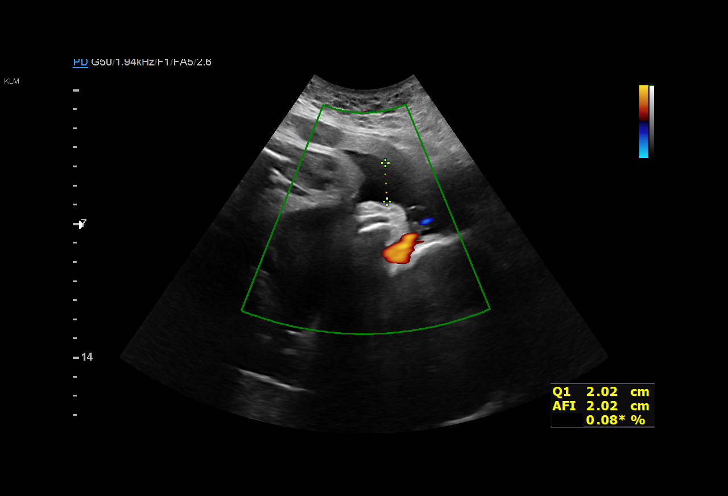
[im 23/39]
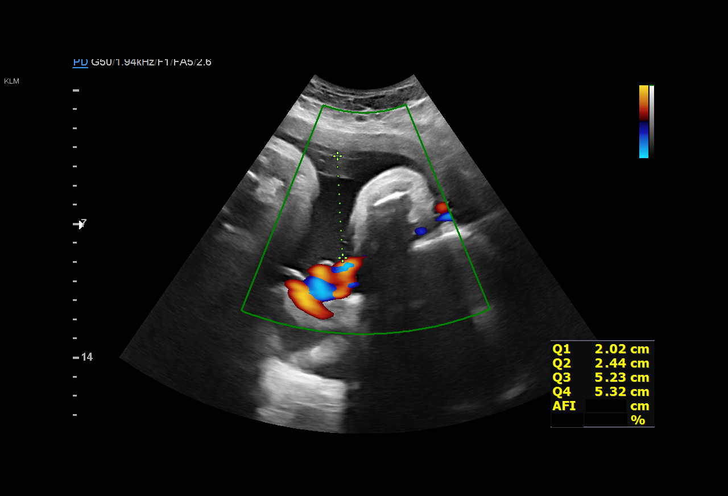
[im 26/39]
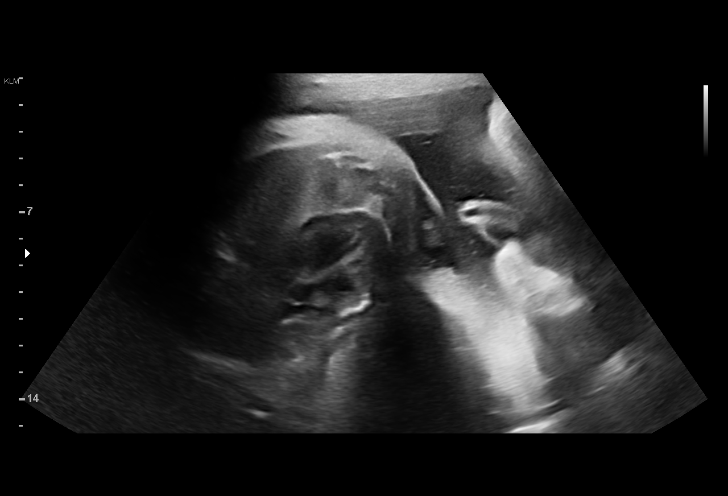
[im 29/39]
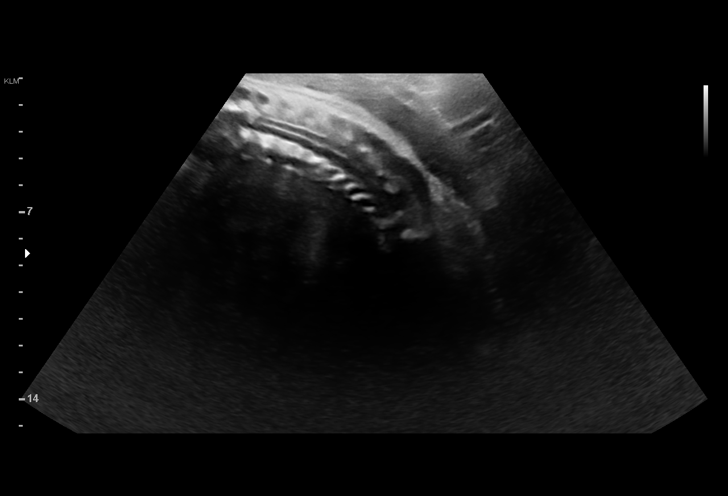
[im 31/39]
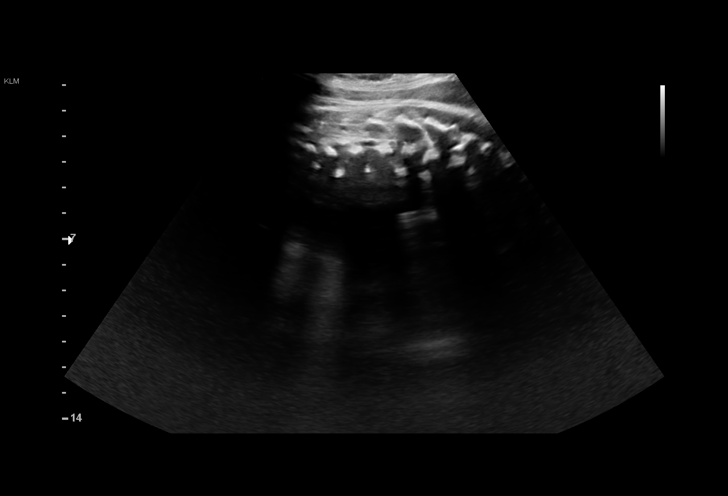
[im 34/39]
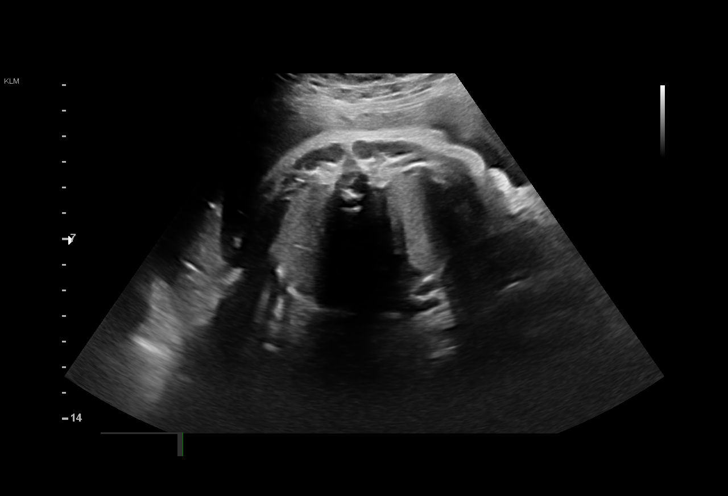
[im 37/39]
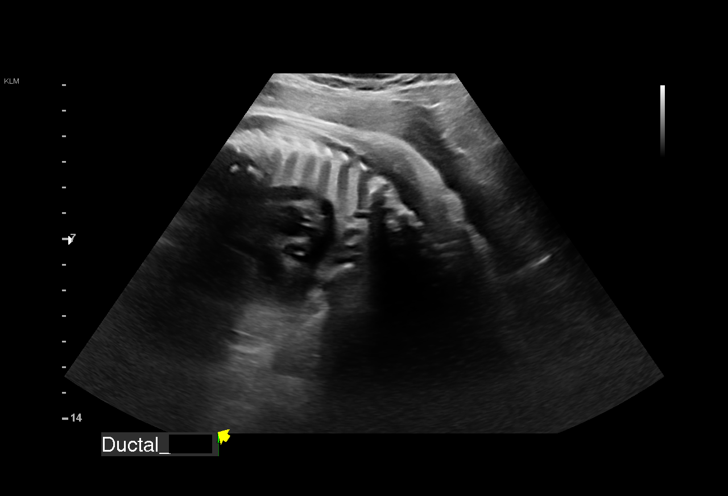

[13 of 28 positions shown; findings below may reference images not displayed]

----------------------------------------------------------------------

 ----------------------------------------------------------------------
Indications

  Hypertension - Chronic/Pre-existing
  36 weeks gestation of pregnancy
  Cerebral ventriculomegaly
  Uterine fibroids affecting pregnancy in third  O34.13,
  trimester, antepartum
  Gestational diabetes in pregnancy, diet
  controlled
  Advanced maternal age multigravida 35+,
  third trimester (38 y/o)
  Late to prenatal care, third trimester
  Antenatal follow-up for nonvisualized fetal
  anatomy
 ----------------------------------------------------------------------
Vital Signs

                                                Height:        5'6"
Fetal Evaluation

 Num Of Fetuses:         1
 Cardiac Activity:       Observed
 Presentation:           Cephalic
 Placenta:               Fundal
 P. Cord Insertion:      Previously Visualized

 Amniotic Fluid
 AFI FV:      Within normal limits
 AFI Sum(cm)     %Tile       Largest Pocket(cm)
 15.01           55

 RUQ(cm)       RLQ(cm)       LUQ(cm)        LLQ(cm)

Biophysical Evaluation

 Amniotic F.V:   Within normal limits       F. Tone:        Observed
 F. Movement:    Observed                   Score:          [DATE]
 F. Breathing:   Observed
Biometry

 BPD:      92.1  mm     G. Age:  37w 3d         88  %    CI:        71.65   %    70 - 86
                                                         FL/HC:      20.1   %    20.1 -
 HC:      346.4  mm     G. Age:  40w 1d         96  %    HC/AC:      1.07        0.93 -
 AC:      325.2  mm     G. Age:  36w 3d         68  %    FL/BPD:     75.5   %    71 - 87
 FL:       69.5  mm     G. Age:  35w 5d         34  %    FL/AC:      21.4   %    20 - 24

 Est. FW:    1469  gm    6 lb 10 oz      77  %
OB History

 Gravidity:    5         Term:   0         SAB:   1
 TOP:          3        Living:  0
Gestational Age

 LMP:           36w 1d        Date:  04/30/18                 EDD:   02/04/19
 U/S Today:     37w 3d                                        EDD:   01/26/19
 Best:          36w 1d     Det. By:  LMP  (04/30/18)          EDD:   02/04/19
Anatomy

 Cranium:               Appears normal         Aortic Arch:            Appears normal
 Cavum:                 Previously seen        Ductal Arch:            Appears normal
 Ventricles:            Ventriculomegaly       Diaphragm:              Appears normal
                        prev seen
 Choroid Plexus:        Previously seen        Stomach:                Appears normal, left
                                                                       sided
 Cerebellum:            Previously seen        Abdomen:                Appears normal
 Posterior Fossa:       Previously seen        Abdominal Wall:         Previously seen
 Nuchal Fold:           Not applicable (>20    Cord Vessels:           Previously seen
                        wks GA)
 Face:                  Orbits and profile     Kidneys:                Appear normal
                        previously seen
 Lips:                  Previously seen        Bladder:                Appears normal
 Thoracic:              Appears normal         Spine:                  Appears normal
 Heart:                 Appears normal         Upper Extremities:      Previously seen
                        (4CH, axis, and
                        situs)
 RVOT:                  Previously seen        Lower Extremities:      Previously seen
 LVOT:                  Previously seen

 Other:  Male gender. Nasal bone previously visualized. Technicallly difficult
         due to advanced GA
Cervix Uterus Adnexa
 Cervix
 Not visualized (advanced GA >23wks)
Impression

 Chronic hypertension. Well-controlled without
 antihypertensives.
 Gestational diabetes. Well-controlled on diet.

 On ultrasound, amniotic fluid is normal and good fetal activity
 is seen. Fetal growth is appropriate for gestational age.
 Antenatal testing is reassuring. BPP [DATE]. We could not
 assess intracranial anatomy well. It appears that
 ventriculomegaly has resolved.
Recommendations

 -Continue weekly BPP till delivery.
                 Gym, Emine Ali

## 2020-03-27 ENCOUNTER — Emergency Department (HOSPITAL_BASED_OUTPATIENT_CLINIC_OR_DEPARTMENT_OTHER)
Admission: EM | Admit: 2020-03-27 | Discharge: 2020-03-27 | Disposition: A | Discharge status: Home / Self Care | Payer: Medicaid Other | Attending: Emergency Medicine | Admitting: Emergency Medicine

## 2020-03-27 ENCOUNTER — Encounter (HOSPITAL_BASED_OUTPATIENT_CLINIC_OR_DEPARTMENT_OTHER): Payer: Self-pay | Admitting: Emergency Medicine

## 2020-03-27 DIAGNOSIS — Z3A01 Less than 8 weeks gestation of pregnancy: Secondary | ICD-10-CM | POA: Insufficient documentation

## 2020-03-27 DIAGNOSIS — Z5321 Procedure and treatment not carried out due to patient leaving prior to being seen by health care provider: Secondary | ICD-10-CM | POA: Diagnosis not present

## 2020-03-27 DIAGNOSIS — O209 Hemorrhage in early pregnancy, unspecified: Secondary | ICD-10-CM | POA: Diagnosis present

## 2020-03-27 NOTE — ED Triage Notes (Signed)
Pt c/o [redacted] weeks pregnant experiencing vaginal bleeding x 1 month then again this morning.

## 2020-04-01 ENCOUNTER — Encounter (HOSPITAL_BASED_OUTPATIENT_CLINIC_OR_DEPARTMENT_OTHER): Payer: Self-pay | Admitting: *Deleted

## 2020-04-01 ENCOUNTER — Emergency Department (HOSPITAL_BASED_OUTPATIENT_CLINIC_OR_DEPARTMENT_OTHER)
Admission: EM | Admit: 2020-04-01 | Discharge: 2020-04-01 | Disposition: A | Discharge status: Home / Self Care | Payer: Medicaid Other | Attending: Emergency Medicine | Admitting: Emergency Medicine

## 2020-04-01 ENCOUNTER — Other Ambulatory Visit: Payer: Self-pay

## 2020-04-01 DIAGNOSIS — Z5321 Procedure and treatment not carried out due to patient leaving prior to being seen by health care provider: Secondary | ICD-10-CM | POA: Diagnosis not present

## 2020-04-01 DIAGNOSIS — N939 Abnormal uterine and vaginal bleeding, unspecified: Secondary | ICD-10-CM | POA: Insufficient documentation

## 2020-04-01 DIAGNOSIS — R1030 Lower abdominal pain, unspecified: Secondary | ICD-10-CM | POA: Diagnosis not present

## 2020-04-01 DIAGNOSIS — Z3A00 Weeks of gestation of pregnancy not specified: Secondary | ICD-10-CM | POA: Insufficient documentation

## 2020-04-01 DIAGNOSIS — O039 Complete or unspecified spontaneous abortion without complication: Secondary | ICD-10-CM | POA: Diagnosis present

## 2020-04-01 NOTE — ED Notes (Signed)
Pt called in waiting area, restrooms and outside er doors, no answer.

## 2020-04-01 NOTE — ED Triage Notes (Signed)
Pt states her lmp was 6/4, she took a preg test at home in July which was positive. She has not seen an ob/gyn, and thinks she had a miscarriage last week, as she started bleeding and passed some tissue. She came here last week for eval, "but the wait was too long" so she left. Pt presents today "to have it checked." reports generalized lower abd pain "aching" and light vaginal bleeding, has used one pad since this am.

## 2020-04-01 NOTE — ED Notes (Signed)
Pt called in waiting room, no answer

## 2020-04-01 NOTE — ED Notes (Signed)
Pt called again, no answer.

## 2020-11-14 IMAGING — US US OB LIMITED
1 series · 14 of 25 positions shown · non-contrast
Comparison: none

CLINICAL DATA: Question pregnancy status. Positive pregnancy test.
History of 2 miscarriages.

EXAM:
LIMITED OBSTETRIC ULTRASOUND AND TRANSVAGINAL OBSTETRIC ULTRASOUND

[Series 1: us ob limited · 0.22mm/px · 14 of 25 slices shown]
[im 1/25]
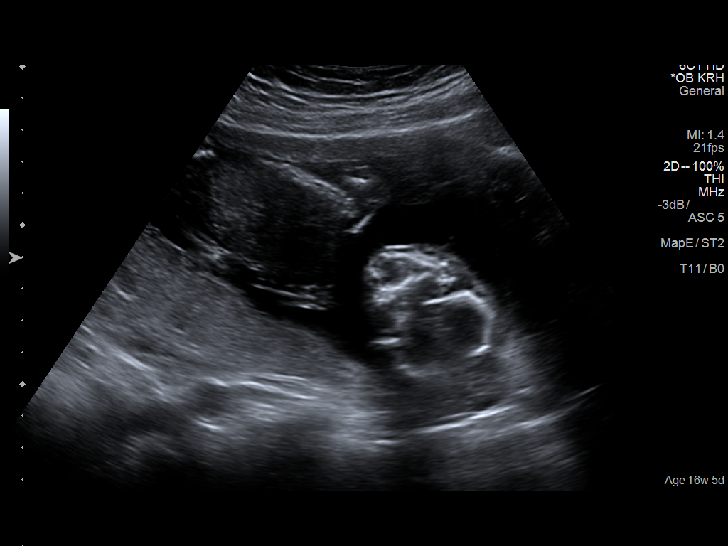
[im 3/25]
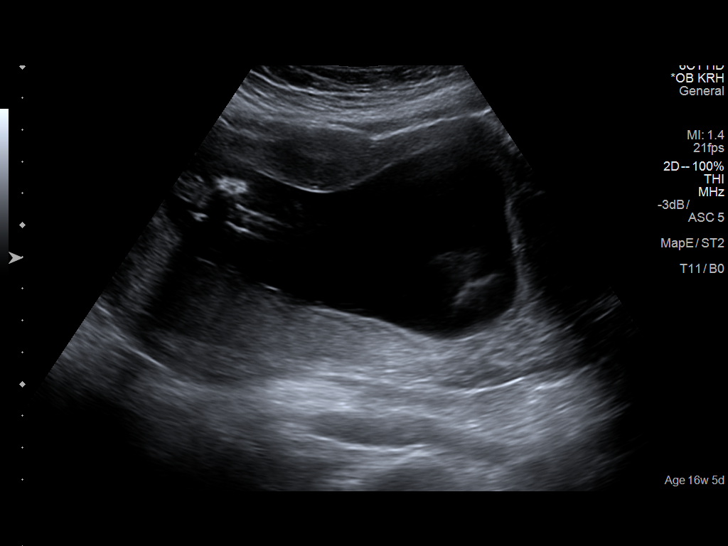
[im 5/25]
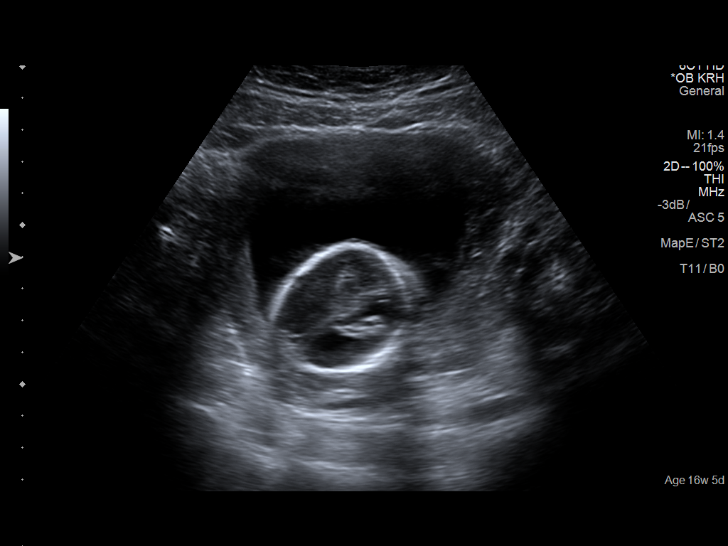
[im 7/25]
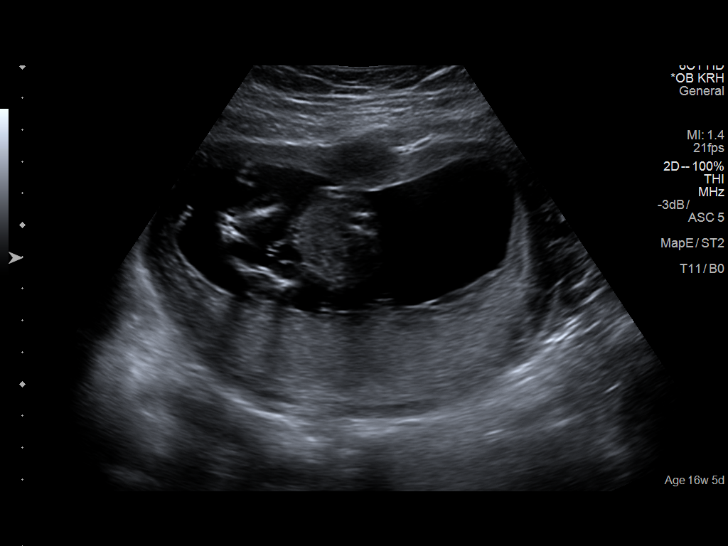
[im 9/25]
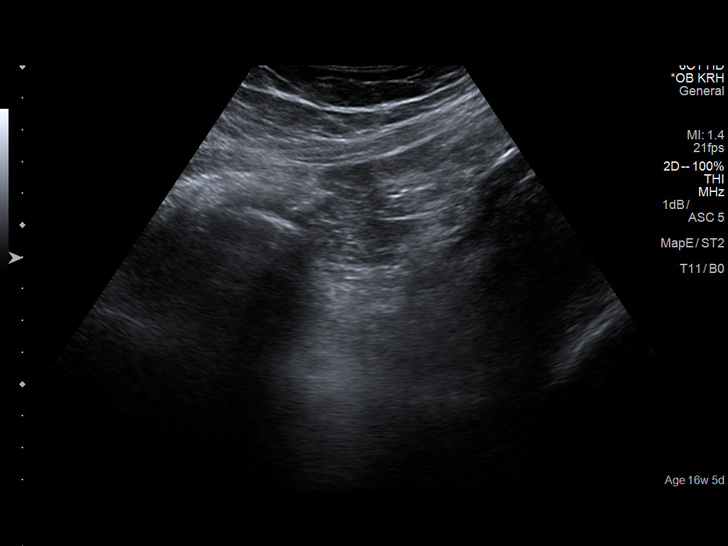
[im 10/25]
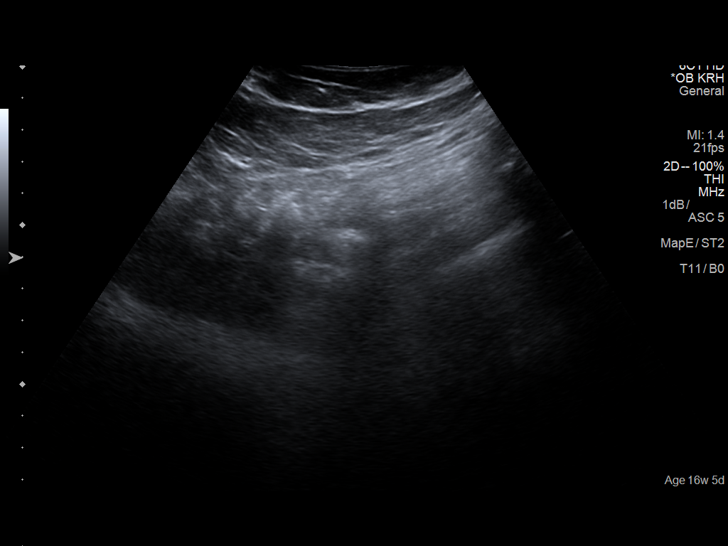
[im 12/25]
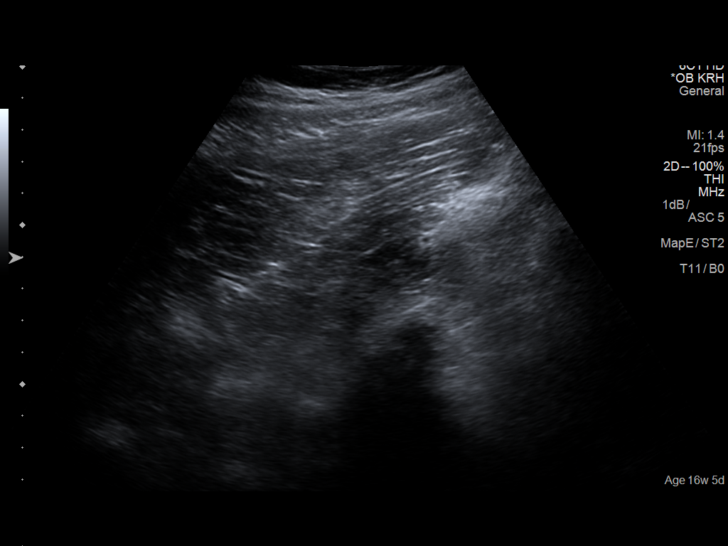
[im 14/25]
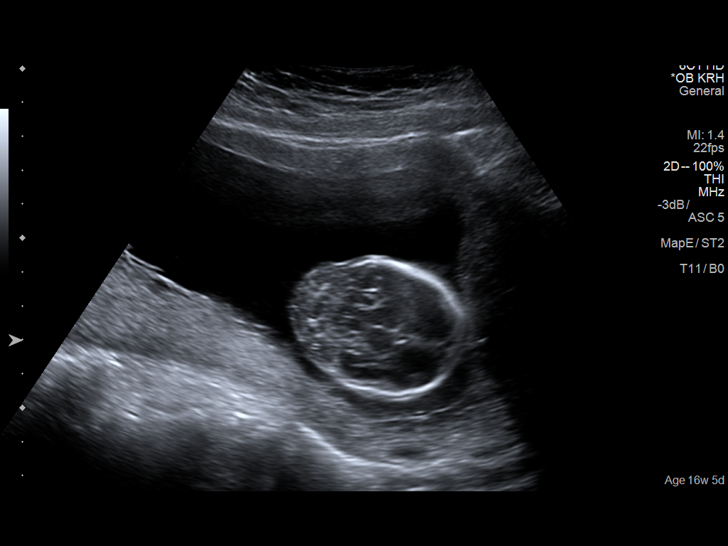
[im 16/25]
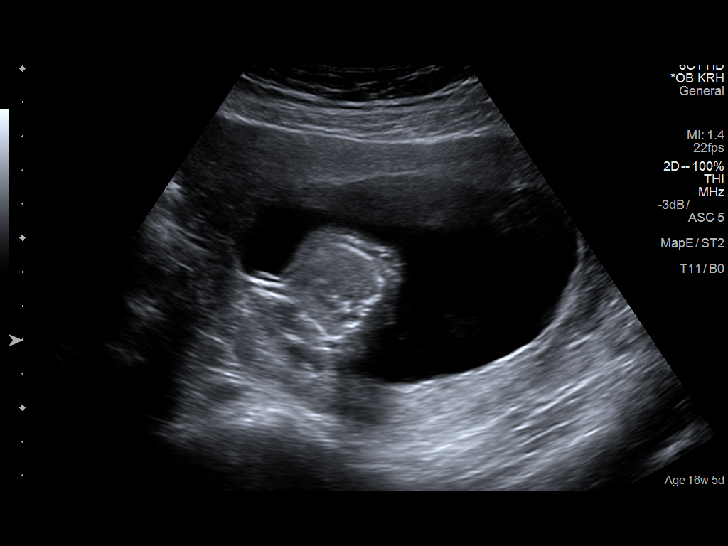
[im 17/25]
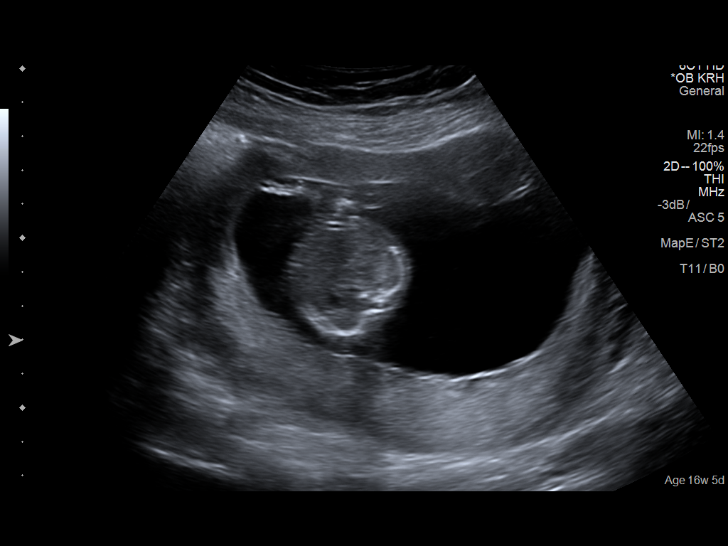
[im 19/25]
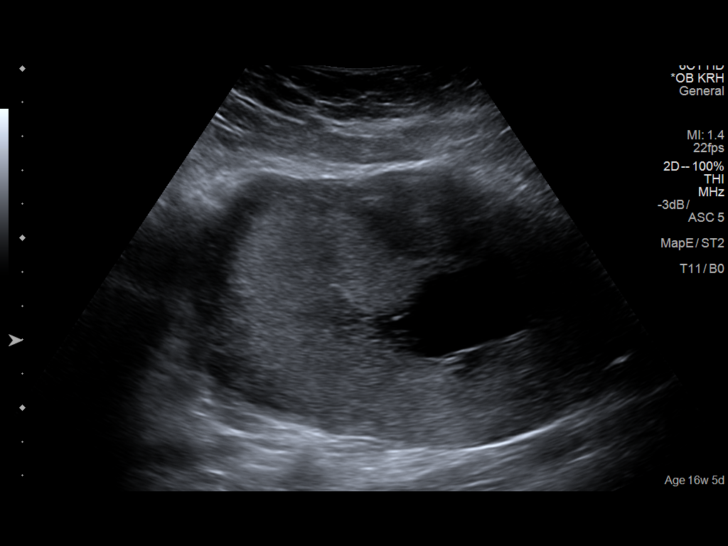
[im 21/25]
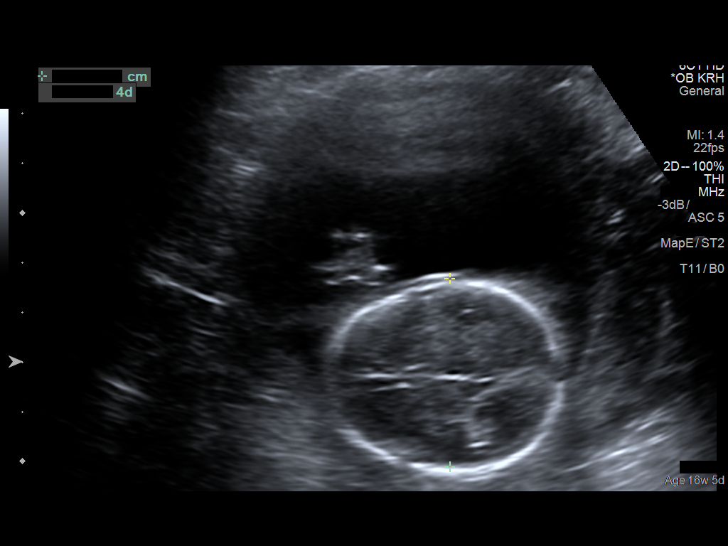
[im 23/25]
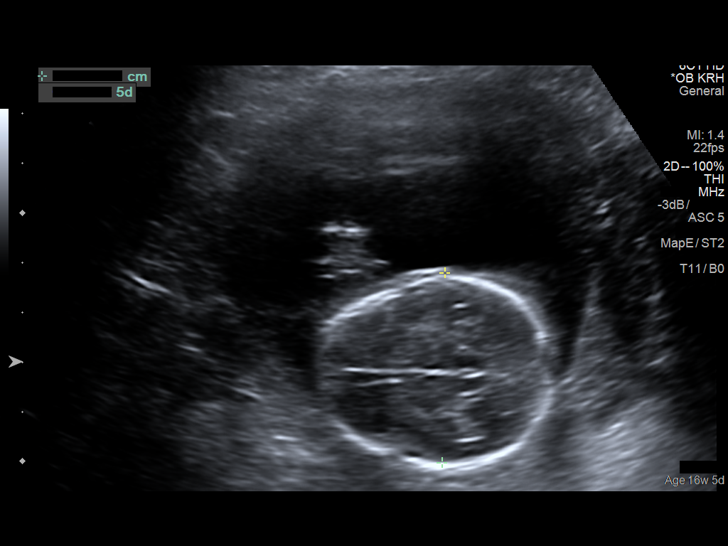
[im 25/25]
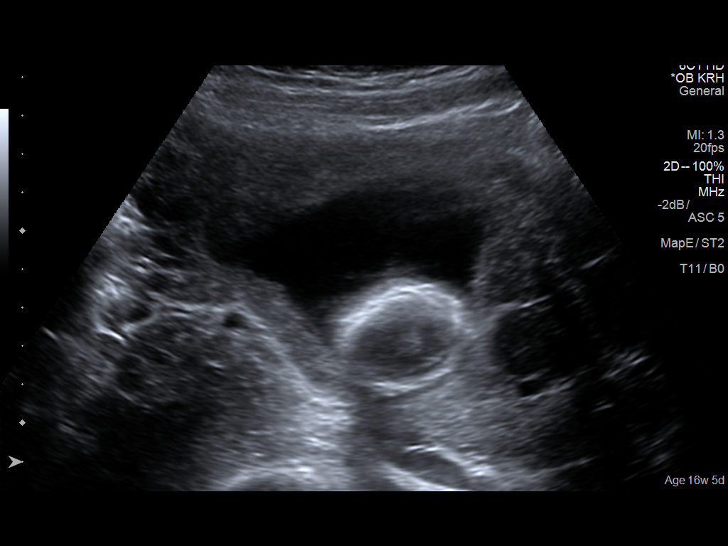

[14 of 25 positions shown; findings below may reference images not displayed]

FINDINGS: Number of Fetuses: 1

Heart Rate:  135 bpm

Movement: Present

Presentation: Cephalic

Placental Location: Posterior fundal

Previa: No

Amniotic Fluid (Subjective): Normal

BPD:  3.8cm 17w 4d

MATERNAL FINDINGS:

Cervix:  Appears closed.

Uterus/Adnexae:  No abnormality visualized.
IMPRESSION: Single viable injury pregnancy at 17 weeks 4 days.

This exam is performed on an emergent basis and does not
comprehensively evaluate fetal size, dating, or anatomy; follow-up
complete OB US should be considered if further fetal assessment is
warranted.

## 2021-07-06 ENCOUNTER — Emergency Department (HOSPITAL_BASED_OUTPATIENT_CLINIC_OR_DEPARTMENT_OTHER)
Admission: EM | Admit: 2021-07-06 | Discharge: 2021-07-06 | Disposition: A | Discharge status: Home / Self Care | Payer: Medicaid Other | Attending: Emergency Medicine | Admitting: Emergency Medicine

## 2021-07-06 ENCOUNTER — Encounter (HOSPITAL_BASED_OUTPATIENT_CLINIC_OR_DEPARTMENT_OTHER): Payer: Self-pay | Admitting: Emergency Medicine

## 2021-07-06 ENCOUNTER — Other Ambulatory Visit: Payer: Self-pay

## 2021-07-06 DIAGNOSIS — R531 Weakness: Secondary | ICD-10-CM | POA: Diagnosis not present

## 2021-07-06 DIAGNOSIS — Z79899 Other long term (current) drug therapy: Secondary | ICD-10-CM | POA: Diagnosis not present

## 2021-07-06 DIAGNOSIS — E119 Type 2 diabetes mellitus without complications: Secondary | ICD-10-CM | POA: Diagnosis not present

## 2021-07-06 DIAGNOSIS — R42 Dizziness and giddiness: Secondary | ICD-10-CM | POA: Diagnosis not present

## 2021-07-06 DIAGNOSIS — I10 Essential (primary) hypertension: Secondary | ICD-10-CM | POA: Diagnosis not present

## 2021-07-06 DIAGNOSIS — Z7982 Long term (current) use of aspirin: Secondary | ICD-10-CM | POA: Insufficient documentation

## 2021-07-06 LAB — BASIC METABOLIC PANEL
Anion gap: 10 (ref 5–15)
BUN: 13 mg/dL (ref 6–20)
CO2: 24 mmol/L (ref 22–32)
Calcium: 9.9 mg/dL (ref 8.9–10.3)
Chloride: 102 mmol/L (ref 98–111)
Creatinine, Ser: 0.78 mg/dL (ref 0.44–1.00)
GFR, Estimated: >60 mL/min (ref >60)
Glucose, Bld: 126 mg/dL — ABNORMAL HIGH (ref 70–99)
Potassium: 4 mmol/L (ref 3.5–5.1)
Sodium: 136 mmol/L (ref 135–145)

## 2021-07-06 LAB — CBC WITH DIFFERENTIAL/PLATELET
Abs Immature Granulocytes: 0.03 10*3/uL (ref 0.00–0.07)
Basophils Absolute: 0.1 10*3/uL (ref 0.0–0.1)
Basophils Relative: 1 %
Eosinophils Absolute: 0 10*3/uL (ref 0.0–0.5)
Eosinophils Relative: 0 %
HCT: 38 % (ref 36.0–46.0)
Hemoglobin: 12.5 g/dL (ref 12.0–15.0)
Immature Granulocytes: 0 %
Lymphocytes Relative: 33 %
Lymphs Abs: 3.2 10*3/uL (ref 0.7–4.0)
MCH: 26.9 pg (ref 26.0–34.0)
MCHC: 32.9 g/dL (ref 30.0–36.0)
MCV: 81.7 fL (ref 80.0–100.0)
Monocytes Absolute: 0.7 10*3/uL (ref 0.1–1.0)
Monocytes Relative: 8 %
Neutro Abs: 5.5 10*3/uL (ref 1.7–7.7)
Neutrophils Relative %: 58 %
Platelets: 274 10*3/uL (ref 150–400)
RBC: 4.65 MIL/uL (ref 3.87–5.11)
RDW: 14.2 % (ref 11.5–15.5)
WBC: 9.5 10*3/uL (ref 4.0–10.5)
nRBC: 0 % (ref 0.0–0.2)

## 2021-07-06 LAB — URINALYSIS, ROUTINE W REFLEX MICROSCOPIC
Bilirubin Urine: NEGATIVE
Glucose, UA: NEGATIVE mg/dL
Hgb urine dipstick: NEGATIVE
Ketones, ur: NEGATIVE mg/dL
Leukocytes,Ua: NEGATIVE
Nitrite: NEGATIVE
Protein, ur: NEGATIVE mg/dL
Specific Gravity, Urine: 1.015 (ref 1.005–1.030)
pH: 7.5 (ref 5.0–8.0)

## 2021-07-06 LAB — CBG MONITORING, ED: Glucose-Capillary: 123 mg/dL — ABNORMAL HIGH (ref 70–99)

## 2021-07-06 LAB — PREGNANCY, URINE: Preg Test, Ur: NEGATIVE

## 2021-07-06 NOTE — Discharge Instructions (Signed)
Keep PCP appointment tomorrow.

## 2021-07-06 NOTE — ED Provider Notes (Signed)
[facility] DEPART[facility] Provider Note   CSN: [id number] Arrival date & time: 07/06/21  1236     History Chief Complaint  Patient presents with   Dizziness    Mariah Livingston is a 40 y.o. female w/ PMHx of DM, HTN, HLD presents with 3 weeks of dizziness and weakness. She feels fine now but did not feel well prior to coming in today. She has also been on a new diet that she started 3 weeks ago and states she notice the symptoms are not as bad when she eats. She denies fever, headache, vision changes, chest pain, abdominal pain. She does not have any other concerns a this time.   The history is provided by the patient.  Dizziness Quality:  Unable to specify Severity:  Mild Onset quality:  Sudden Duration:  3 months Timing:  Intermittent Progression:  Resolved Chronicity:  New Relieved by:  Food Associated symptoms: weakness   Associated symptoms: no chest pain, no diarrhea, no headaches, no nausea, no palpitations, no shortness of breath, no tinnitus, no vision changes and no vomiting       Past Medical History:  Diagnosis Date   Gestational diabetes    Hypertension    Miscarriage     Patient Active Problem List   Diagnosis Date Noted   S/P cesarean section 02/06/2019   Late prenatal care affecting pregnancy 01/30/2019   Malpresentation before onset of labor 01/30/2019   GBS (group B Streptococcus carrier), +RV culture, currently pregnant 01/30/2019   Malpresentation of fetus 01/30/2019   Oligohydramnios 01/30/2019   Gestational diabetes 01/13/2019   Abnormal glucose tolerance test (GTT) during pregnancy, antepartum 12/31/2018   Uterine fibroids affecting pregnancy 12/16/2018   Severe Preeclampsia superimposed on chronic hypertension, postpartum 12/02/2018   Supervision of other normal pregnancy, antepartum 12/01/2018   Essential hypertension 08/16/2014    Past Surgical History:  Procedure Laterality Date   CESAREAN SECTION N/A 02/01/2019    Procedure: CESAREAN SECTION;  Surgeon: Chancy Milroy, MD;  Location: [facility] ORS;  Service: Obstetrics;  Laterality: N/A;   COSMETIC SURGERY     abdominal liposuction   HERNIA REPAIR  2015   5-6 years      OB History     Gravida  5   Para  1   Term  1   Preterm  0   AB  4   Living  1      SAB  1   IAB  3   Ectopic  0   Multiple  0   Live Births  1           Family History  Problem Relation Age of Onset   Hypertension Mother     Social History   Tobacco Use   Smoking status: Never   Smokeless tobacco: Never  Vaping Use   Vaping Use: Never used  Substance Use Topics   Alcohol use: Not Currently    Alcohol/week: 1.0 standard drink    Types: 1 Cans of beer per week    Comment: 1-2 cans daily of beer, more on weekend   Drug use: No    Home Medications Prior to Admission medications   Medication Sig Start Date End Date Taking? Authorizing Provider  acetaminophen (TYLENOL) 500 MG tablet Take 500 mg by mouth every 6 (six) hours as needed.    [provider]  amLODipine (NORVASC) 10 MG tablet Take 1 tablet (10 mg total) by mouth daily. Patient not taking: Reported on  03/10/2019 02/07/19   Osborne Oman, MD  aspirin EC 81 MG tablet Take 1 tablet (81 mg total) by mouth daily. Patient not taking: Reported on 12/11/2018 12/02/18   Woodroe Mode, MD  enalapril (VASOTEC) 10 MG tablet Take 1 tablet (10 mg total) by mouth 2 (two) times daily. 02/06/19   Anyanwu, Sallyanne Havers, MD  Ferrous Fumarate (HEMOCYTE - 106 MG FE) 324 (106 Fe) MG TABS tablet Take 1 tablet (106 mg of iron total) by mouth 2 (two) times daily. Patient not taking: Reported on 03/10/2019 02/06/19   Anyanwu, Sallyanne Havers, MD  ibuprofen (ADVIL) 600 MG tablet Take 1 tablet (600 mg total) by mouth every 6 (six) hours as needed for headache, mild pain, moderate pain or cramping. Patient not taking: Reported on 03/10/2019 02/06/19   Anyanwu, Sallyanne Havers, MD  oxyCODONE (OXY IR/ROXICODONE) 5 MG immediate  release tablet Take 1 tablet (5 mg total) by mouth every 4 (four) hours as needed for moderate pain. Patient not taking: Reported on 03/10/2019 02/06/19   Osborne Oman, MD  Prenatal Vit-Fe Fumarate-FA (PRENATAL COMPLETE) 14-0.4 MG TABS Take 1 tablet by mouth daily. Patient not taking: Reported on 03/10/2019 10/25/14   Pisciotta, Elmyra Ricks, PA-C  senna-docusate (SENOKOT-S) 8.6-50 MG tablet Take 2 tablets by mouth 2 (two) times daily as needed for mild constipation or moderate constipation. Patient not taking: Reported on 03/10/2019 02/06/19   Osborne Oman, MD    Allergies    Patient has no known allergies.  Review of Systems   Review of Systems  Constitutional:  Negative for fever.  HENT:  Positive for congestion. Negative for tinnitus.   Eyes:  Negative for visual disturbance.  Respiratory:  Negative for shortness of breath.   Cardiovascular:  Negative for chest pain and palpitations.  Gastrointestinal:  Negative for diarrhea, nausea and vomiting.  Genitourinary:  Negative for difficulty urinating and menstrual problem.  Musculoskeletal:  Negative for arthralgias and myalgias.  Neurological:  Positive for dizziness and weakness. Negative for headaches.   Physical Exam Updated Vital Signs BP (!) 133/97   Pulse 87   Temp 98.9 F (37.2 C) (Oral)   Resp 16   Ht '5\' 6"'  (1.676 m)   Wt 74.8 kg   LMP 06/20/2021 (Exact Date)   SpO2 100%   BMI 26.63 kg/m   Physical Exam Vitals and nursing note reviewed.  Constitutional:      General: She is not in acute distress.    Appearance: Normal appearance. She is not ill-appearing, toxic-appearing or diaphoretic.  HENT:     Head: Normocephalic.     Right Ear: External ear normal.     Left Ear: External ear normal.  Eyes:     Extraocular Movements: Extraocular movements intact.     Conjunctiva/sclera: Conjunctivae normal.  Cardiovascular:     Rate and Rhythm: Normal rate and regular rhythm.     Pulses: Normal pulses.     Heart sounds:  Normal heart sounds.  Pulmonary:     Effort: Pulmonary effort is normal.     Breath sounds: Normal breath sounds.  Abdominal:     General: Abdomen is flat. Bowel sounds are normal.     Palpations: Abdomen is soft.  Neurological:     Mental Status: She is alert and oriented to person, place, and time.  Psychiatric:        Mood and Affect: Mood normal.        Behavior: Behavior normal.    ED Results /  Procedures / Treatments   Labs (all labs ordered are listed, but only abnormal results are displayed) Labs Reviewed  BASIC METABOLIC PANEL - Abnormal; Notable for the following components:      Result Value   Glucose, Bld 126 (*)    All other components within normal limits  URINALYSIS, ROUTINE W REFLEX MICROSCOPIC - Abnormal; Notable for the following components:   Color, Urine STRAW (*)    All other components within normal limits  CBG MONITORING, ED - Abnormal; Notable for the following components:   Glucose-Capillary 123 (*)    All other components within normal limits  CBC WITH DIFFERENTIAL/PLATELET  PREGNANCY, URINE    EKG EKG Interpretation  Date/Time:  Thursday July 06 2021 13:26:09 EST Ventricular Rate:  91 PR Interval:  146 QRS Duration: 68 QT Interval:  356 QTC Calculation: 437 R Axis:   76 Text Interpretation: Normal sinus rhythm Right atrial enlargement Borderline ECG No significant change since last tracing Confirmed by Isla Pence [phone]) on 07/06/2021 1:49:08 PM  Radiology No results found.  Procedures Procedures   Medications Ordered in ED Medications - No data to display  ED Course  I have reviewed the triage vital signs and the nursing notes.  Pertinent labs & imaging results that were available during my care of the patient were reviewed by me and considered in my medical decision making (see chart for details).    MDM Rules/Calculators/A&P                          40 yo F w/ PMHx of DM, HTN, HLD presents with 3 weeks of dizziness  and weakness that resolved prior to this evaluation. She admits to a new diet starting 3 weeks prior as well. She noticed these symptoms are associated with decreased PO intake and improved with food.   VSS. Sitting on site of bed with child present. Appears well she is in no acute distress. Physical exam is unremarkable. She denies a myriad of symptoms as above. Urine pregnancy is negative. UA, BMP, and CBC wnl. CBG 123.   Patient has follow up scheduled with primary care doctor tomorrow. Encouraged follow up tomorrow and to increase hydration and continue to eat at regular intervals. Patient agreeable with this plan.   Final Clinical Impression(s) / ED Diagnoses Final diagnoses:  Dizziness    Rx / DC Orders ED Discharge Orders     None        Gerlene Fee, DO 07/06/21 1430    Isla Pence, MD 07/06/21 1436

## 2021-07-06 NOTE — ED Triage Notes (Signed)
Dizziness x 3 weeks , no further symptoms

## 2022-11-16 ENCOUNTER — Institutional Professional Consult (permissible substitution): Payer: Medicaid Other | Admitting: Plastic Surgery

## 2023-12-24 ENCOUNTER — Encounter (HOSPITAL_BASED_OUTPATIENT_CLINIC_OR_DEPARTMENT_OTHER): Payer: Self-pay | Admitting: *Deleted

## 2023-12-24 ENCOUNTER — Emergency Department (HOSPITAL_BASED_OUTPATIENT_CLINIC_OR_DEPARTMENT_OTHER)

## 2023-12-24 ENCOUNTER — Other Ambulatory Visit: Payer: Self-pay

## 2023-12-24 ENCOUNTER — Emergency Department (HOSPITAL_BASED_OUTPATIENT_CLINIC_OR_DEPARTMENT_OTHER)
Admission: EM | Admit: 2023-12-24 | Discharge: 2023-12-24 | Disposition: A | Discharge status: Home / Self Care | Attending: Emergency Medicine | Admitting: Emergency Medicine

## 2023-12-24 DIAGNOSIS — Z7982 Long term (current) use of aspirin: Secondary | ICD-10-CM | POA: Insufficient documentation

## 2023-12-24 DIAGNOSIS — M545 Low back pain, unspecified: Secondary | ICD-10-CM | POA: Diagnosis not present

## 2023-12-24 DIAGNOSIS — R079 Chest pain, unspecified: Secondary | ICD-10-CM | POA: Diagnosis not present

## 2023-12-24 DIAGNOSIS — Z79899 Other long term (current) drug therapy: Secondary | ICD-10-CM | POA: Insufficient documentation

## 2023-12-24 DIAGNOSIS — M542 Cervicalgia: Secondary | ICD-10-CM | POA: Diagnosis present

## 2023-12-24 DIAGNOSIS — Y92410 Unspecified street and highway as the place of occurrence of the external cause: Secondary | ICD-10-CM | POA: Insufficient documentation

## 2023-12-24 LAB — PREGNANCY, URINE: Preg Test, Ur: NEGATIVE

## 2023-12-24 MED ORDER — ACETAMINOPHEN 325 MG PO TABS
650.0000 mg | ORAL_TABLET | Freq: Once | ORAL | Status: AC
  Administered 2023-12-24: 650 mg via ORAL
  Filled 2023-12-24: qty 2
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
